# Patient Record
Sex: Female | Born: 1950 | Race: White | Hispanic: No | Smoking: Never smoker
Health system: Southern US, Community
[De-identification: ages and names within clinical notes are randomized; demographics above are authoritative.]

## PROBLEM LIST (undated history)

## (undated) DIAGNOSIS — Z5189 Encounter for other specified aftercare: Secondary | ICD-10-CM

## (undated) DIAGNOSIS — K922 Gastrointestinal hemorrhage, unspecified: Secondary | ICD-10-CM

## (undated) DIAGNOSIS — IMO0001 Reserved for inherently not codable concepts without codable children: Secondary | ICD-10-CM

## (undated) HISTORY — PX: ABDOMINAL HYSTERECTOMY: SHX81

## (undated) HISTORY — PX: TONSILLECTOMY: SUR1361

---

## 2011-03-18 ENCOUNTER — Emergency Department (INDEPENDENT_AMBULATORY_CARE_PROVIDER_SITE_OTHER): Payer: BC Managed Care – PPO

## 2011-03-18 ENCOUNTER — Encounter: Payer: Self-pay | Admitting: *Deleted

## 2011-03-18 ENCOUNTER — Emergency Department (HOSPITAL_BASED_OUTPATIENT_CLINIC_OR_DEPARTMENT_OTHER)
Admission: EM | Admit: 2011-03-18 | Discharge: 2011-03-19 | Disposition: A | Discharge status: Home / Self Care | Payer: BC Managed Care – PPO | Attending: Emergency Medicine | Admitting: Emergency Medicine

## 2011-03-18 DIAGNOSIS — M79609 Pain in unspecified limb: Secondary | ICD-10-CM | POA: Insufficient documentation

## 2011-03-18 DIAGNOSIS — S8010XA Contusion of unspecified lower leg, initial encounter: Secondary | ICD-10-CM

## 2011-03-18 DIAGNOSIS — W1809XA Striking against other object with subsequent fall, initial encounter: Secondary | ICD-10-CM

## 2011-03-18 DIAGNOSIS — T148XXA Other injury of unspecified body region, initial encounter: Secondary | ICD-10-CM

## 2011-03-18 DIAGNOSIS — W010XXA Fall on same level from slipping, tripping and stumbling without subsequent striking against object, initial encounter: Secondary | ICD-10-CM | POA: Insufficient documentation

## 2011-03-18 DIAGNOSIS — Y92009 Unspecified place in unspecified non-institutional (private) residence as the place of occurrence of the external cause: Secondary | ICD-10-CM | POA: Insufficient documentation

## 2011-03-18 DIAGNOSIS — S022XXA Fracture of nasal bones, initial encounter for closed fracture: Secondary | ICD-10-CM | POA: Insufficient documentation

## 2011-03-18 NOTE — ED Notes (Signed)
Pt tripped over her home gym and injured her nose and left lower leg. Pt's nose was bleeding but now has stopped. Pt has small superficial lac to bridge of nose.

## 2011-03-19 MED ORDER — TETANUS-DIPHTH-ACELL PERTUSSIS 5-2.5-18.5 LF-MCG/0.5 IM SUSP
INTRAMUSCULAR | Status: AC
  Filled 2011-03-19: qty 0.5

## 2011-03-19 MED ORDER — HYDROCODONE-ACETAMINOPHEN 5-500 MG PO TABS: 1.0000 | ORAL_TABLET | Freq: Four times a day (QID) | ORAL | Status: AC | PRN

## 2011-03-19 MED ORDER — TETANUS-DIPHTH-ACELL PERTUSSIS 5-2.5-18.5 LF-MCG/0.5 IM SUSP
0.5000 mL | Freq: Once | INTRAMUSCULAR | Status: AC
  Administered 2011-03-19: 0.5 mL via INTRAMUSCULAR

## 2011-03-19 NOTE — ED Provider Notes (Signed)
History     CSN: [id number] Arrival date & time: 03/18/2011 10:02 PM  Chief Complaint  Patient presents with  . Fall   HPI Patient tripped and fell over furniture tonight injuring her left shin and nose no loss of consciousness she complains of no pain presently. No other injury  History reviewed. No pertinent past medical history.  Past Surgical History  Procedure Date  . Abdominal hysterectomy     No family history on file.  History  Substance Use Topics  . Smoking status: Never Smoker   . Smokeless tobacco: Not on file  . Alcohol Use: Yes    OB History    Grav Para Term Preterm Abortions TAB SAB Ect Mult Living                  Review of Systems  Constitutional: Negative.   HENT: Positive for nosebleeds.        Suffered nosebleed and laceration to bridge of nose as result of fall  Respiratory: Negative.   Cardiovascular: Negative.   Gastrointestinal: Negative.   Musculoskeletal: Negative.   Skin: Negative.   Neurological: Negative.   Hematological: Negative.   Psychiatric/Behavioral: Negative.     Physical Exam  BP 117/72  Pulse 81  Temp(Src) 97.5 F (36.4 C) (Oral)  Resp 17  Ht 5\' 4"  (1.626 m)  Wt 130 lb (58.968 kg)  BMI 22.31 kg/m2  SpO2 98%  Physical Exam  Constitutional: She is oriented to person, place, and time. She appears well-developed and well-nourished.  HENT:  Head: Normocephalic and atraumatic.  Right Ear: External ear normal.  Left Ear: External ear normal.       Dried blood at both nares no septal hematoma there is a 0.5 cm laceration at the bridge of the nose with edges together no active bleeding minimal soft tissue swelling to nose  Eyes: Conjunctivae are normal. Pupils are equal, round, and reactive to light.  Neck: Neck supple. No tracheal deviation present. No thyromegaly present.       No tenderness  Cardiovascular: Normal rate and regular rhythm.   No murmur heard. Pulmonary/Chest: Effort normal and breath sounds normal.    Abdominal: Soft. Bowel sounds are normal. She exhibits no distension. There is no tenderness.  Musculoskeletal: Normal range of motion. She exhibits no edema and no tenderness.  Neurological: She is alert and oriented to person, place, and time. Coordination normal.       Gait normal  Skin: Skin is warm and dry. No rash noted.       A 3 cm purplish ecchymosis to the left distal shin with minimal corresponding tenderness no soft tissue swelling  Psychiatric: She has a normal mood and affect.    ED Course  Procedures  MDM Nasal lacerations not require repair plan prescription hydrocodone-APAP ENT referral Dr. Annalee Genta when necessary one week      Doug Sou, MD 03/19/11 0127

## 2011-05-21 ENCOUNTER — Other Ambulatory Visit: Payer: Self-pay

## 2011-05-21 ENCOUNTER — Emergency Department (HOSPITAL_BASED_OUTPATIENT_CLINIC_OR_DEPARTMENT_OTHER)
Admission: EM | Admit: 2011-05-21 | Discharge: 2011-05-21 | Disposition: A | Discharge status: Home / Self Care | Payer: BC Managed Care – PPO | Attending: Emergency Medicine | Admitting: Emergency Medicine

## 2011-05-21 ENCOUNTER — Encounter (HOSPITAL_BASED_OUTPATIENT_CLINIC_OR_DEPARTMENT_OTHER): Payer: Self-pay | Admitting: *Deleted

## 2011-05-21 DIAGNOSIS — D649 Anemia, unspecified: Secondary | ICD-10-CM | POA: Insufficient documentation

## 2011-05-21 DIAGNOSIS — K922 Gastrointestinal hemorrhage, unspecified: Secondary | ICD-10-CM

## 2011-05-21 DIAGNOSIS — R42 Dizziness and giddiness: Secondary | ICD-10-CM | POA: Insufficient documentation

## 2011-05-21 DIAGNOSIS — F410 Panic disorder [episodic paroxysmal anxiety] without agoraphobia: Secondary | ICD-10-CM | POA: Insufficient documentation

## 2011-05-21 HISTORY — DX: Gastrointestinal hemorrhage, unspecified: K92.2

## 2011-05-21 HISTORY — DX: Encounter for other specified aftercare: Z51.89

## 2011-05-21 HISTORY — DX: Reserved for inherently not codable concepts without codable children: IMO0001

## 2011-05-21 LAB — BASIC METABOLIC PANEL
BUN: 22 mg/dL (ref 6–23)
Chloride: 104 mEq/L (ref 96–112)
GFR calc Af Amer: >90 mL/min (ref >90)
GFR calc non Af Amer: >90 mL/min (ref >90)
Potassium: 3.7 mEq/L (ref 3.5–5.1)
Sodium: 139 mEq/L (ref 135–145)

## 2011-05-21 LAB — CBC
HCT: 22.7 % — ABNORMAL LOW (ref 36.0–46.0)
Hemoglobin: 7.7 g/dL — ABNORMAL LOW (ref 12.0–15.0)
MCHC: 33.9 g/dL (ref 30.0–36.0)
RDW: 12.6 % (ref 11.5–15.5)
WBC: 7.6 10*3/uL (ref 4.0–10.5)

## 2011-05-21 LAB — APTT: aPTT: 25 seconds (ref 24–37)

## 2011-05-21 LAB — PROTIME-INR: INR: 0.94 (ref 0.00–1.49)

## 2011-05-21 MED ORDER — LORAZEPAM 1 MG PO TABS
1.0000 mg | ORAL_TABLET | Freq: Once | ORAL | Status: AC
  Administered 2011-05-21: 1 mg via ORAL
  Filled 2011-05-21: qty 1

## 2011-05-21 MED ORDER — ONDANSETRON HCL 4 MG/2ML IJ SOLN
4.0000 mg | Freq: Once | INTRAMUSCULAR | Status: AC
  Administered 2011-05-21: 4 mg via INTRAVENOUS
  Filled 2011-05-21: qty 2

## 2011-05-21 NOTE — ED Notes (Signed)
Family at bedside.Pt reports "panic attack" today with chest tightness and SOB symtom resolved upon arrival

## 2011-05-21 NOTE — ED Notes (Signed)
Pt states she has been dizzy for a few days and had chigger bites on legs. Took allergy medicine and began having black stools, felt like she was having a panic attack. Went home, ate bananas and drank water which usually helps, but it didn't. Mother is sick and her job is stressful. H/A this a.m. None now.

## 2011-05-21 NOTE — ED Provider Notes (Addendum)
History    Scribed for Nat Christen, MD, the patient was seen in room MH08/MH08. This chart was scribed by Katha Cabal. This patient's care was started at 3:52 PM.    CSN: [id number] Arrival date & time: 05/21/2011  3:36 PM  Chief Complaint  Patient presents with  . Panic Attack    (Consider location/radiation/quality/duration/timing/severity/associated sxs/prior treatment) HPI  Jocelyn Green is a 60 y.o. female who presents to the Emergency Department complaining of gradual onset of panic attack.  Reports intermittent dizziness for last 3 days.  Patient complains of  tremors, bilaterally lower extremity weakness, nausea, and headache this AM.  Patient has had dark BMs for past couple days. States previous dark stools in past required her to have blood transfusion for GI bleed. Denies chest pain, vomiting, and abdominal pain.  Reports that she has panic attacks previously with similar sx.  Reports increase in stress with mother's Alzheimer's disease and financial stress.   Patient adds that she has chigger bites on legs.  Per son:  Patient using Benadryl to control itching. [facility]      PAST MEDICAL HISTORY:  Past Medical History  Diagnosis Date  . Blood transfusion   . GI bleed     PAST SURGICAL HISTORY:  Past Surgical History  Procedure Date  . Abdominal hysterectomy     FAMILY HISTORY:  History reviewed. No pertinent family history.   SOCIAL HISTORY: History   Social History  . Marital Status: Divorced    Spouse Name: N/A    Number of Children: N/A  . Years of Education: N/A   Social History Main Topics  . Smoking status: Never Smoker   . Smokeless tobacco: None  . Alcohol Use: Yes  . Drug Use: No  . Sexually Active:    Other Topics Concern  . None   Social History Narrative  . None    Review of Systems 10 Systems reviewed and are negative for acute change except as noted in the HPI.  Allergies  Review of patient's allergies  indicates no known allergies.  Home Medications   Current Outpatient Rx  Name Route Sig Dispense Refill  . DIPHENHYDRAMINE HCL 25 MG PO CAPS Oral Take 50 mg by mouth every 6 (six) hours as needed. Seasonal allergies     . OXYMETAZOLINE HCL 0.05 % NA SOLN Nasal Place 1-2 sprays into the nose at bedtime. For congestion       BP 163/80  Pulse 90  Temp(Src) 98 F (36.7 C) (Oral)  Resp 24  Ht 5\' 4"  (1.626 m)  Wt 130 lb (58.968 kg)  BMI 22.31 kg/m2  SpO2 100%  Physical Exam  Constitutional: She is oriented to person, place, and time. She appears well-developed and well-nourished.  Non-toxic appearance. She does not have a sickly appearance.  HENT:  Head: Normocephalic and atraumatic.  Eyes: Conjunctivae, EOM and lids are normal. Pupils are equal, round, and reactive to light. No scleral icterus.  Neck: Trachea normal and normal range of motion. Neck supple.  Cardiovascular: Normal rate, regular rhythm and normal heart sounds.  Exam reveals no gallop and no friction rub.   No murmur heard. Pulmonary/Chest: Effort normal and breath sounds normal. No respiratory distress. She has no wheezes.  Abdominal: Soft. Normal appearance. There is no tenderness. There is no rebound, no guarding and no CVA tenderness.  Genitourinary: Rectum normal. Rectal exam shows no external hemorrhoid, no internal hemorrhoid, no tenderness and anal tone normal.  Hemoccult positive with dark stool.    Musculoskeletal: Normal range of motion.  Neurological: She is alert and oriented to person, place, and time. She has normal strength. No sensory deficit. Gait normal.       No facial asymmetry.  No visual fields deficits.   Speech clear.    Skin: Skin is warm, dry and intact. No rash noted.  Psychiatric: Her speech is normal. Her mood appears anxious.    ED Course  Procedures (including critical care time)  OTHER DATA REVIEWED: Nursing notes, vital signs, and past medical records reviewed.   DIAGNOSTIC  STUDIES: Oxygen Saturation is 100% on room air, normal by my interpretation.       LABS / RADIOLOGY:  Labs Reviewed  CBC - Abnormal; Notable for the following:    RBC 2.39 (*)    Hemoglobin 7.7 (*)    HCT 22.7 (*)    All other components within normal limits  BASIC METABOLIC PANEL - Abnormal; Notable for the following:    Glucose, Bld 111 (*)    All other components within normal limits  PROTIME-INR  APTT   Results for orders placed during the hospital encounter of 05/21/11  CBC      Component Value Range   WBC 7.6  4.0 - 10.5 (K/uL)   RBC 2.39 (*) 3.87 - 5.11 (MIL/uL)   Hemoglobin 7.7 (*) 12.0 - 15.0 (g/dL)   HCT 04.5 (*) 40.9 - 46.0 (%)   MCV 95.0  78.0 - 100.0 (fL)   MCH 32.2  26.0 - 34.0 (pg)   MCHC 33.9  30.0 - 36.0 (g/dL)   RDW 81.1  91.4 - 78.2 (%)   Platelets 224  150 - 400 (K/uL)  BASIC METABOLIC PANEL      Component Value Range   Sodium 139  135 - 145 (mEq/L)   Potassium 3.7  3.5 - 5.1 (mEq/L)   Chloride 104  96 - 112 (mEq/L)   CO2 27  19 - 32 (mEq/L)   Glucose, Bld 111 (*) 70 - 99 (mg/dL)   BUN 22  6 - 23 (mg/dL)   Creatinine, Ser 9.56  0.50 - 1.10 (mg/dL)   Calcium 9.4  8.4 - 21.3 (mg/dL)   GFR calc non Af Amer >90  >90 (mL/min)   GFR calc Af Amer >90  >90 (mL/min)  PROTIME-INR      Component Value Range   Prothrombin Time 12.8  11.6 - 15.2 (seconds)   INR 0.94  0.00 - 1.49   APTT      Component Value Range   aPTT 25  24 - 37 (seconds)        ED COURSE / COORDINATION OF CARE: 4:22 PM  Physical exam complete.  Patient is anxious.  Rectal exam normal.  Hemoccult positive with dark stool.  No melena.   5:55 PM  Recheck.  Oxygen turned off to assess breathing on room air.    Orders Placed This Encounter  Procedures  . CBC  . Basic metabolic panel  . Protime-INR  . APTT  . EKG test  . Saline lock IV      MEDICATIONS GIVEN IN THE E.D. Scheduled Meds:    . LORazepam  1 mg Oral Once  . ondansetron [facility]) IV  4 mg Intravenous Once    Continuous Infusions:    Date: 05/21/2011  Rate: 82  Rhythm: normal sinus rhythm  QRS Axis: normal  Intervals: normal  ST/T Wave abnormalities: normal  Conduction Disutrbances:none  Narrative Interpretation:   Old EKG Reviewed: none available     MDM  MDM: Patient with GI bleed on history with her Hemoccult-positive exam. Her vitals have been stable here and as we do not have a regular blood bank here I have not completed a type and screen here. I feel at this point in time the patient can be transferred safely to [facility] regional without beginning blood transfusion here. The hospitalist Dr. Janit Pagan at [facility] is aware of this fact. He has accepted this patient in transfer for admission to a medical telemetry bed for further repeat hemoglobins and evaluation. Patient is aware of her pending transfer and is okay with being transferred to [facility] regional as this was her choice. Patient does not appear to have any cardiac events causing her symptoms her dizziness as her dizziness is likely due to her anemia. She may have some overriding component of anxiety given her other stressors at this time. These anxiety symptoms have significantly improved with the Ativan here.   CRITICAL CARE Performed by: Emeline General A   Total critical care time: 36 min  Critical care time was exclusive of separately billable procedures and treating other patients.  Critical care was necessary to treat or prevent imminent or life-threatening deterioration.  Critical care was time spent personally by me on the following activities: development of treatment plan with patient and/or surrogate as well as nursing, discussions with consultants, evaluation of patient's response to treatment, examination of patient, obtaining history from patient or surrogate, ordering and performing treatments and interventions, ordering and review of laboratory studies, ordering and review of radiographic  studies, pulse oximetry and re-evaluation of patient's condition.    IMPRESSION: No diagnosis found.     I personally performed the services described in this documentation, which was scribed in my presence. The recorded information has been reviewed and considered.          Nat Christen, MD 05/21/11 1820  Nat Christen, MD 05/21/11 Rickey Primus

## 2011-12-26 ENCOUNTER — Emergency Department (HOSPITAL_BASED_OUTPATIENT_CLINIC_OR_DEPARTMENT_OTHER): Payer: BC Managed Care – PPO

## 2011-12-26 ENCOUNTER — Encounter (HOSPITAL_BASED_OUTPATIENT_CLINIC_OR_DEPARTMENT_OTHER): Payer: Self-pay | Admitting: Family Medicine

## 2011-12-26 ENCOUNTER — Emergency Department (HOSPITAL_BASED_OUTPATIENT_CLINIC_OR_DEPARTMENT_OTHER)
Admission: EM | Admit: 2011-12-26 | Discharge: 2011-12-26 | Disposition: A | Discharge status: Home / Self Care | Payer: BC Managed Care – PPO | Attending: Emergency Medicine | Admitting: Emergency Medicine

## 2011-12-26 DIAGNOSIS — M654 Radial styloid tenosynovitis [de Quervain]: Secondary | ICD-10-CM | POA: Insufficient documentation

## 2011-12-26 DIAGNOSIS — M25539 Pain in unspecified wrist: Secondary | ICD-10-CM | POA: Insufficient documentation

## 2011-12-26 NOTE — ED Provider Notes (Signed)
History     CSN: [id number]  Arrival date & time 12/26/11  1235   First MD Initiated Contact with Patient 12/26/11 1418      Chief Complaint  Patient presents with  . 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  She's had a gradual onset over the last several weeks without specific trauma of pain at the base of her left thumb without weakness or numbness. She is no redness or swelling. She is no specific injury to the area exception bumped it slightly about a month ago but not have any concern of a broken bone at that time. She has had no treatment prior to arrival. She is no radiation of her discomfort. She does not want pain medicine. Her discomfort is mild to moderate. She suspects she has tendinitis from overuse but wants to make sure. Her pain is localized, it is tender to palpation, and it is worse with extension and abduction of her thumb. Past Medical History  Diagnosis Date  . Blood transfusion   . GI bleed     Past Surgical History  Procedure Date  . Abdominal hysterectomy     No family history on file.  History  Substance Use Topics  . Smoking status: Never Smoker   . Smokeless tobacco: Not on file  . Alcohol Use: Yes    OB History    Grav Para Term Preterm Abortions TAB SAB Ect Mult Living                  Review of Systems  Constitutional: Negative for fever.       10 Systems reviewed and are negative for acute change except as noted in the HPI.  HENT: Negative for congestion.   Eyes: Negative for discharge and redness.  Respiratory: Negative for cough and shortness of breath.   Cardiovascular: Negative for chest pain.  Gastrointestinal: Negative for vomiting and abdominal pain.  Musculoskeletal: Negative for back pain.    Skin: Negative for rash.  Neurological: Negative for syncope, numbness and headaches.  Psychiatric/Behavioral:       No behavior change.    Allergies  Review of patient's allergies indicates no known allergies.  Home Medications   Current Outpatient Rx  Name Route Sig Dispense Refill  . DIPHENHYDRAMINE HCL 25 MG PO CAPS Oral Take 50 mg by mouth every 6 (six) hours as needed. Seasonal allergies     . OXYMETAZOLINE HCL 0.05 % NA SOLN Nasal Place 1-2 sprays into the nose at bedtime. For congestion       BP 162/95  Pulse 72  Temp(Src) 97.6 F (36.4 C) (Oral)  Resp 16  SpO2 100%  Physical Exam  Nursing note and vitals reviewed. Constitutional:       Awake, alert, nontoxic appearance.  HENT:  Head: Atraumatic.  Eyes: Right eye exhibits no discharge. Left eye exhibits no discharge.  Neck: Neck supple.  Pulmonary/Chest: Effort normal. She exhibits no tenderness.  Abdominal: Soft. There is no tenderness. There is no rebound.  Musculoskeletal: She exhibits tenderness. She exhibits no edema.       Baseline ROM, no obvious new focal weakness. Both legs and right arm are nontender the left arm is nontender the shoulder upper arm elbow forearm and wrist including no snuffbox tenderness. She does have reproducible base of the  left thumb tenderness mostly over the radial-carpal tendon area with no tenderness over the thenar eminence and normal light touch with capillary refill less than 2 seconds good extension against resistance as well as good flexion of the thumb against resistance. She is no bony or joint pain to the interphalangeal joint or metacarpophalangeal joint of the left thumb.  Neurological:       Mental status and motor strength appears baseline for patient and situation.  Skin: No rash noted.  Psychiatric: She has a normal mood and affect.    ED Course  Procedures (including critical care time)  Labs Reviewed - No data to display No results found.   1. De Quervain's  tenosynovitis, left       MDM  Pt stable in ED with no significant deterioration in condition.Patient / Family / Caregiver informed of clinical course, understand medical decision-making process, and agree with plan.I doubt any other EMC precluding discharge at this time including, but not necessarily limited to the following:SBI.        Hurman Horn, MD 01/02/12 1740

## 2011-12-26 NOTE — Discharge Instructions (Signed)
De Quervain's Tenosynovitis De Quervain's tenosynovitis involves inflammation of one or two tendon linings (sheaths) or strain of one or two tendons to the thumb: extensor pollicis brevis (EPB), or abductor pollicis longus (APL). This causes pain on the side of the wrist and base of the thumb. Tendon sheaths secrete a fluid that lubricates the tendon, allowing the tendon to move smoothly. When the sheath becomes inflamed, the tendon cannot move freely in the sheath. Both the EPB and APL tendons are important for proper use of the hand. The EPB tendon is important for straightening the thumb. The APL tendon is important for moving the thumb away from the index finger (abducting). The two tendons pass through a small tube (canal) in the wrist, near the base of the thumb. When the tendons become inflamed, pain is usually felt in this area. SYMPTOMS   Pain, tenderness, swelling, warmth, or redness over the base of the thumb and thumb side of the wrist.   Pain that gets worse when straightening the thumb.   Pain that gets worse when moving the thumb away from the index finger, against resistance.   Pain with pinching or gripping.   Locking or catching of the thumb.   Limited motion of the thumb.   Crackling sound (crepitation) when the tendon or thumb is moved or touched.   Fluid-filled cyst in the area of the base of the thumb.  CAUSES   Tenosynovitis is often linked with overuse of the wrist.   Tenosynovitis may be caused by repeated injury to the thumb muscle and tendon units, and with repeated motions of the hand and wrist, due to friction of the tendon within the lining (sheath).   Tenosynovitis may also be due to a sudden increase in activity or change in activity.  RISK INCREASES WITH:  Sports that involve repeated hand and wrist motions (golf, bowling, tennis, squash, racquetball).   Heavy labor.   Poor physical wrist strength and flexibility.   Failure to warm up properly before  practice or play.   Female gender.   New mothers who hold their baby's head for long periods or lift infants with thumbs in the infant's armpit (axilla).  PREVENTION  Warm up and stretch properly before practice or competition.   Allow enough time for rest and recovery between practices and competition.   Maintain appropriate conditioning:   Cardiovascular fitness.   Forearm, wrist, and hand flexibility.   Muscle strength and endurance.   Use proper exercise technique.  PROGNOSIS  This condition is usually curable within 6 weeks, if treated properly with non-surgical treatment and resting of the affected area.  RELATED COMPLICATIONS   Longer healing time if not properly treated or if not given enough time to heal.   Chronic inflammation, causing recurring symptoms of tenosynovitis. Permanent pain or restriction of movement.   Risks of surgery: infection, bleeding, injury to nerves (numbness of the thumb), continued pain, incomplete release of the tendon sheath, recurring symptoms, cutting of the tendons, tendons sliding out of position, weakness of the thumb, thumb stiffness.  TREATMENT  First, treatment involves the use of medicine and ice, to reduce pain and inflammation. Patients are encouraged to stop or modify activities that aggravate the injury. Stretching and strengthening exercises may be advised. Exercises may be completed at home or with a therapist. You may be fitted with a brace or splint, to limit motion and allow the injury to heal. Your caregiver may also choose to give you a corticosteroid injection, to   reduce the pain and inflammation. If non-surgical treatment is not successful, surgery may be needed. Most tenosynovitis surgeries are done as outpatient procedures (you go home the same day). Surgery may involve local, regional (whole arm), or general anesthesia.  MEDICATION   If pain medicine is needed, nonsteroidal anti-inflammatory medicines (aspirin and  ibuprofen), or other minor pain relievers (acetaminophen), are often advised.   Do not take pain medicine for 7 days before surgery.   Prescription pain relievers are often prescribed only after surgery. Use only as directed and only as much as you need.   Corticosteroid injections may be given if your caregiver thinks they are needed. There is a limited number of times these injections may be given.  COLD THERAPY   Cold treatment (icing) should be applied for 10 to 15 minutes every 2 to 3 hours for inflammation and pain, and immediately after activity that aggravates your symptoms. Use ice packs or an ice massage.  SEEK MEDICAL CARE IF:   Symptoms get worse or do not improve in 2 to 4 weeks, despite treatment.   You experience pain, numbness, or coldness in the hand.   Blue, gray, or dark color appears in the fingernails.   Any of the following occur after surgery: increased pain, swelling, redness, drainage of fluids, bleeding in the affected area, or signs of infection.   New, unexplained symptoms develop. (Drugs used in treatment may produce side effects.)  Document Released: 07/25/2005 Document Revised: 07/14/2011 Document Reviewed: 11/06/2008 [employer] Patient Information 2012 [employer]. 

## 2011-12-26 NOTE — ED Notes (Signed)
Pt c/o left wrist and left thumb pain for "weeks". Pt denies injury.

## 2012-05-03 ENCOUNTER — Encounter (HOSPITAL_BASED_OUTPATIENT_CLINIC_OR_DEPARTMENT_OTHER): Payer: Self-pay

## 2012-05-03 ENCOUNTER — Emergency Department (HOSPITAL_BASED_OUTPATIENT_CLINIC_OR_DEPARTMENT_OTHER)
Admission: EM | Admit: 2012-05-03 | Discharge: 2012-05-03 | Disposition: A | Discharge status: Home / Self Care | Payer: BC Managed Care – PPO | Attending: Emergency Medicine | Admitting: Emergency Medicine

## 2012-05-03 ENCOUNTER — Emergency Department (HOSPITAL_BASED_OUTPATIENT_CLINIC_OR_DEPARTMENT_OTHER): Payer: BC Managed Care – PPO

## 2012-05-03 DIAGNOSIS — IMO0002 Reserved for concepts with insufficient information to code with codable children: Secondary | ICD-10-CM

## 2012-05-03 DIAGNOSIS — M25473 Effusion, unspecified ankle: Secondary | ICD-10-CM | POA: Insufficient documentation

## 2012-05-03 DIAGNOSIS — M25476 Effusion, unspecified foot: Secondary | ICD-10-CM | POA: Insufficient documentation

## 2012-05-03 DIAGNOSIS — M7989 Other specified soft tissue disorders: Secondary | ICD-10-CM | POA: Insufficient documentation

## 2012-05-03 NOTE — ED Provider Notes (Signed)
Medical screening examination/treatment/procedure(s) were performed by non-physician practitioner and as supervising physician I was immediately available for consultation/collaboration.   Glena Pharris, MD 05/03/12 1756 

## 2012-05-03 NOTE — ED Provider Notes (Signed)
History     CSN: [id number]  Arrival date & time 05/03/12  1437   First MD Initiated Contact with Patient 05/03/12 [phone]      Chief Complaint  Patient presents with  . Foot Injury    (Consider location/radiation/quality/duration/timing/severity/associated sxs/prior treatment) Patient is a 61 y.o. female presenting with foot injury. The history is provided by the patient. No language interpreter was used.  Foot Injury  Incident onset: 2 weeks. The incident occurred at home. There was no injury mechanism. The pain is present in the left foot. The quality of the pain is described as aching. The pain is at a severity of 5/10. The pain is moderate. The pain has been constant since onset. Associated symptoms include inability to bear weight. Nothing aggravates the symptoms. She has tried nothing for the symptoms.  pt reports she hit a piece of white metal.  Past Medical History  Diagnosis Date  . Blood transfusion   . GI bleed     Past Surgical History  Procedure Date  . Abdominal hysterectomy     No family history on file.  History  Substance Use Topics  . Smoking status: Never Smoker   . Smokeless tobacco: Not on file  . Alcohol Use: Yes    OB History    Grav Para Term Preterm Abortions TAB SAB Ect Mult Living                  Review of Systems  Musculoskeletal: Positive for myalgias and joint swelling.  All other systems reviewed and are negative.    Allergies  Review of patient's allergies indicates no known allergies.  Home Medications   Current Outpatient Rx  Name Route Sig Dispense Refill  . DIPHENHYDRAMINE HCL 25 MG PO CAPS Oral Take 50 mg by mouth every 6 (six) hours as needed. Seasonal allergies     . OXYMETAZOLINE HCL 0.05 % NA SOLN Nasal Place 1-2 sprays into the nose at bedtime. For congestion       BP 169/84  Pulse 88  Temp 98.3 F (36.8 C) (Oral)  Resp 16  Ht 5\' 4"  (1.626 m)  Wt 128 lb (58.06 kg)  BMI 21.97 kg/m2  SpO2 100%  Physical  Exam  Nursing note and vitals reviewed. Constitutional: She is oriented to person, place, and time. She appears well-developed and well-nourished.  HENT:  Head: Normocephalic and atraumatic.  Musculoskeletal: She exhibits tenderness.       Tender left foot,  From,  Ns and nv intact  Neurological: She is alert and oriented to person, place, and time.  Skin: Skin is warm and dry.  Psychiatric: She has a normal mood and affect.    ED Course  Procedures (including critical care time)  Labs Reviewed - No data to display Dg Foot Complete Left  05/03/2012  *RADIOLOGY REPORT*  Clinical Data: Foot injury.  LEFT FOOT - COMPLETE 3+ VIEW  Comparison: Not  Findings: No acute bony abnormality.  Specifically, no fracture, subluxation, or dislocation.  Soft tissues are intact. Joint spaces are maintained.  Normal bone mineralization.  IMPRESSION: Normal study.   Original Report Authenticated By: Cyndie Chime, M.D.      1. Swelling of joint of left ankle or foot       MDM  Pt placed ina  Jones dressing and post op shoe.   I advised follow up with Dr. Pearletha Forge.        Lonia Skinner [place], [place] 05/03/12 1618  Lonia Skinner  [place], [place] 05/03/12 1621

## 2012-05-03 NOTE — ED Notes (Signed)
Left foot injury 3 weeks ago-c/o cont'd swelling, pain

## 2012-05-08 ENCOUNTER — Ambulatory Visit (INDEPENDENT_AMBULATORY_CARE_PROVIDER_SITE_OTHER): Payer: BC Managed Care – PPO | Admitting: Family Medicine

## 2012-05-08 ENCOUNTER — Encounter: Payer: Self-pay | Admitting: Family Medicine

## 2012-05-08 VITALS — BP 152/91 | HR 78 | Ht 64.0 in | Wt 130.0 lb

## 2012-05-08 DIAGNOSIS — M1990 Unspecified osteoarthritis, unspecified site: Secondary | ICD-10-CM

## 2012-05-08 DIAGNOSIS — M129 Arthropathy, unspecified: Secondary | ICD-10-CM

## 2012-05-08 DIAGNOSIS — S92323A Displaced fracture of second metatarsal bone, unspecified foot, initial encounter for closed fracture: Secondary | ICD-10-CM

## 2012-05-08 DIAGNOSIS — S92309A Fracture of unspecified metatarsal bone(s), unspecified foot, initial encounter for closed fracture: Secondary | ICD-10-CM

## 2012-05-08 DIAGNOSIS — M654 Radial styloid tenosynovitis [de Quervain]: Secondary | ICD-10-CM

## 2012-05-08 NOTE — Patient Instructions (Addendum)
You have a 2nd metatarsal fracture that is healing well by ultrasound. Ice area 15 minutes at a time as needed. Elevate above the level of your heart when possible to help with swelling as needed. Cam walker or cast with postop shoe - will need immobilization likely for a full 6 weeks (so 3-4 more weeks). Tylenol as needed during day, oxycodone as needed in evenings (no driving on oxycodone and no driving with a cast or the boot on). Follow up with me in 2 weeks if in a cast otherwise in 3-4 weeks if you're wearing the boot. These are very stable fractures and heal well with this strategy.  For arthritis: Take tylenol 500mg  1-2 tabs three times a day for pain. Glucosamine sulfate 750mg  twice a day is a supplement that has been shown to help moderate to severe arthritis. Capsaicin topically up to four times a day may also help with pain. Cortisone injections are an option for certain joints (knee, shoulder, hip). If cortisone injections do not help, there are different types of shots that may help but they take longer to take effect. It's important that you continue to stay active. Consider physical therapy to strengthen muscles around the joint that hurts to take pressure off of the joint itself. Shoe inserts with good arch support may be helpful. Walker or cane if needed. Heat or ice 15 minutes at a time 3-4 times a day as needed to help with pain. Water aerobics and cycling with low resistance are the best two types of exercise for arthritis.  You have deQuervain's tenosynovitis, a degeneration and scar tissue formation within the tendons that go into your thumb. Avoid painful activities as much as possible. Wear the thumb spica brace as often as possible to rest this. Ice 15 minutes at a time 3-4 times a day A cortisone injection typically helps a great deal with this and is an option. You can repeat up to 2 injections but if these still do not help, surgical referral is indicated.

## 2012-05-14 ENCOUNTER — Encounter: Payer: Self-pay | Admitting: Family Medicine

## 2012-05-14 DIAGNOSIS — S92323A Displaced fracture of second metatarsal bone, unspecified foot, initial encounter for closed fracture: Secondary | ICD-10-CM | POA: Insufficient documentation

## 2012-05-14 DIAGNOSIS — M654 Radial styloid tenosynovitis [de Quervain]: Secondary | ICD-10-CM | POA: Insufficient documentation

## 2012-05-14 DIAGNOSIS — M1990 Unspecified osteoarthritis, unspecified site: Secondary | ICD-10-CM | POA: Insufficient documentation

## 2012-05-14 NOTE — Progress Notes (Signed)
Subjective:    Patient ID: Jocelyn Green, female    DOB: 26-Mar-1951, 61 y.o.   MRN: [id number]  PCP: [facility]  HPI 61 yo F here primarily for left foot injury but also to discuss left thumb pain, arthritis.  Patient reports about 2-3 weeks ago she accidentally hit top of foot on a large metal object. Very painful with swelling and bruising following this. Limped for about 2 weeks. Radiographs in ED read as normal - put in postop shoe with Jones wrap and referred here. Has been doing this regularly but shoe is painful. Icing some. Difficulty bearing weight comfortably. No prior left foot injuries.  Also reports several weeks radial sided wrist pain. 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 No prior left wrist injuries. Mild swelling but no bruising.  In general asking about medications to treat arthritis as well.  Past Medical History  Diagnosis Date  . Blood transfusion   . GI bleed     No current outpatient prescriptions on file prior to visit.    Past Surgical History  Procedure Date  . Abdominal hysterectomy     No Known Allergies  History   Social History  . Marital Status: Divorced    Spouse Name: N/A    Number of Children: N/A  . Years of Education: N/A   Occupational History  . Not on file.   Social History Main Topics  . Smoking status: Never Smoker   . Smokeless tobacco: Not on file  . Alcohol Use: Yes  . Drug Use: No  . Sexually Active: Not on file   Other Topics Concern  . Not on file   Social History Narrative  . No narrative on file    Family History  Problem Relation Age of Onset  . Sudden death Neg Hx   . Hyperlipidemia Neg Hx   . Heart attack Neg Hx   . Diabetes Neg Hx   . Hypertension Neg Hx     BP 152/91  Pulse 78  Ht 5\' 4"  (1.626 m)  Wt 130 lb (58.968 kg)  BMI 22.31 kg/m2  Review of Systems See HPI above.    Objective:   Physical Exam Gen: NAD  L foot/ankle: Mild swelling dorsal foot.  No bruising,  other deformity. FROM TTP 2nd > 3rd metatarsal.  No other TTP about foot or ankle. Negative ant drawer and talar tilt.   Negative syndesmotic compression. Thompsons test negative. NV intact distally.  L hand/wrist: No gross deformity, swelling, ecchymoses FROM TTP 1st dorsal compartment. + finkelsteins. NVI distally.  MSK u/s: 2nd metatarsal fracture identified with callus formation, increased neovascularity, and edema overlying bony cortex.    Assessment & Plan:  1. 2nd metatarsal fracture - healing well by ultrasound.  Cam walker for total of 6 weeks.  Icing, elevation.  Tylenol as needed during day with percocet as needed (q6h prn #60) in evenings and bedtime.  F/u in 3-4 weeks for reevaluation.  2. DeQuervain's tenosynovitis - discussed thumb spica brace +/- injection.  She would like to do injection today.  Icing discussed.  Discuss how she's doing at f/u for #1 above.  After informed written consent patient was seated on exam table.  Area overlying 1st dorsal compartment left wrist was prepped with alcohol swab then 1st dorsal compartment injected with 1:1 marcaine: depomedrol.  Patient tolerated procedure well without immediate complications.  3. Arthritis - Discussed tylenol, capsaicin, glucosamine.  Avoid nsaids with her prior h/o GI  bleed.

## 2012-05-14 NOTE — Assessment & Plan Note (Signed)
healing well by ultrasound.  Cam walker for total of 6 weeks.  Icing, elevation.  Tylenol as needed during day with percocet as needed (q6h prn #60) in evenings and bedtime.  F/u in 3-4 weeks for reevaluation.

## 2012-05-14 NOTE — Assessment & Plan Note (Signed)
Discussed tylenol, capsaicin, glucosamine.  Avoid nsaids with her prior h/o GI bleed.

## 2012-05-14 NOTE — Assessment & Plan Note (Signed)
discussed thumb spica brace +/- injection.  She would like to do injection today.  Icing discussed.  Discuss how she's doing at f/u for #1 above.  After informed written consent patient was seated on exam table.  Area overlying 1st dorsal compartment left wrist was prepped with alcohol swab then 1st dorsal compartment injected with 1:1 marcaine: depomedrol.  Patient tolerated procedure well without immediate complications.

## 2012-06-05 ENCOUNTER — Encounter: Payer: Self-pay | Admitting: Family Medicine

## 2012-06-05 ENCOUNTER — Ambulatory Visit (INDEPENDENT_AMBULATORY_CARE_PROVIDER_SITE_OTHER): Payer: BC Managed Care – PPO | Admitting: Family Medicine

## 2012-06-05 VITALS — BP 137/77 | HR 75 | Ht 64.0 in | Wt 130.0 lb

## 2012-06-05 DIAGNOSIS — S92309A Fracture of unspecified metatarsal bone(s), unspecified foot, initial encounter for closed fracture: Secondary | ICD-10-CM

## 2012-06-05 DIAGNOSIS — M654 Radial styloid tenosynovitis [de Quervain]: Secondary | ICD-10-CM

## 2012-06-05 DIAGNOSIS — S92323A Displaced fracture of second metatarsal bone, unspecified foot, initial encounter for closed fracture: Secondary | ICD-10-CM

## 2012-06-05 NOTE — Progress Notes (Signed)
  Subjective:    Patient ID: Jocelyn Green, female    DOB: [birth year], 61 y.o.   MRN: [id number]  PCP: [facility]  HPI  61 yo F here for f/u left 2nd MT fracture, thumb pain.  10/1: Patient reports about 2-3 weeks ago she accidentally hit top of foot on a large metal object. Very painful with swelling and bruising following this. Limped for about 2 weeks. Radiographs in ED read as normal - put in postop shoe with Jones wrap and referred here. Has been doing this regularly but shoe is painful. Icing some. Difficulty bearing weight comfortably. No prior left foot injuries.  Also reports several weeks radial sided wrist pain. 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 No prior left wrist injuries. Mild swelling but no bruising.  10/29: Patient reports she still has aching in left foot at nighttime but has improved. Has been wearing cam walker regularly with ambulation. Has not tried to put full weight on foot outside of boot. Not taking any medicines now or icing this.  Past Medical History  Diagnosis Date  . Blood transfusion   . GI bleed     No current outpatient prescriptions on file prior to visit.    Past Surgical History  Procedure Date  . Abdominal hysterectomy     No Known Allergies  History   Social History  . Marital Status: Divorced    Spouse Name: N/A    Number of Children: N/A  . Years of Education: N/A   Occupational History  . Not on file.   Social History Main Topics  . Smoking status: Never Smoker   . Smokeless tobacco: Not on file  . Alcohol Use: Yes  . Drug Use: No  . Sexually Active: Not on file   Other Topics Concern  . Not on file   Social History Narrative  . No narrative on file    Family History  Problem Relation Age of Onset  . Sudden death Neg Hx   . Hyperlipidemia Neg Hx   . Heart attack Neg Hx   . Diabetes Neg Hx   . Hypertension Neg Hx     BP 137/77  Pulse 75  Ht 5\' 4"  (1.626 m)  Wt 130 lb (58.968 kg)   BMI 22.31 kg/m2  Review of Systems  See HPI above.    Objective:   Physical Exam  Gen: NAD  L foot/ankle: No swelling dorsal foot.  No bruising, other deformity. FROM Mild TTP 2nd > 3rd metatarsal.  No other TTP about foot or ankle. Negative ant drawer and talar tilt.   Negative syndesmotic compression. Thompsons test negative. NV intact distally.  L hand/wrist: No gross deformity, swelling, ecchymoses FROM No longer with TTP 1st dorsal compartment. Negative finkelsteins. NVI distally.  MSK u/s: 2nd metatarsal with excellent bony callus, no longer with swelling over cortex or neovascularity, appears healed.    Assessment & Plan:  1. 2nd metatarsal fracture - excellent healing now more than 6 weeks out from injury.  Can discontinue cam walker though discussed she may need this with long walking for a couple weeks.  Comfortable well supported shoe for next couple weeks.  F/u prn.  2. DeQuervain's tenosynovitis - improved though not completely so.  Would not repeat injection at this time.  Thumb spica brace.

## 2012-06-05 NOTE — Assessment & Plan Note (Signed)
DeQuervain's tenosynovitis - improved though not completely so.  Would not repeat injection at this time.  Thumb spica brace.

## 2012-06-05 NOTE — Assessment & Plan Note (Signed)
2nd metatarsal fracture - excellent healing now more than 6 weeks out from injury.  Can discontinue cam walker though discussed she may need this with long walking for a couple weeks.  Comfortable well supported shoe for next couple weeks.  F/u prn.

## 2012-10-13 ENCOUNTER — Emergency Department (HOSPITAL_BASED_OUTPATIENT_CLINIC_OR_DEPARTMENT_OTHER): Payer: BC Managed Care – PPO

## 2012-10-13 ENCOUNTER — Encounter (HOSPITAL_BASED_OUTPATIENT_CLINIC_OR_DEPARTMENT_OTHER): Payer: Self-pay

## 2012-10-13 ENCOUNTER — Emergency Department (HOSPITAL_BASED_OUTPATIENT_CLINIC_OR_DEPARTMENT_OTHER)
Admission: EM | Admit: 2012-10-13 | Discharge: 2012-10-13 | Disposition: A | Discharge status: Home / Self Care | Payer: BC Managed Care – PPO | Attending: Emergency Medicine | Admitting: Emergency Medicine

## 2012-10-13 DIAGNOSIS — Y9289 Other specified places as the place of occurrence of the external cause: Secondary | ICD-10-CM | POA: Insufficient documentation

## 2012-10-13 DIAGNOSIS — Y9301 Activity, walking, marching and hiking: Secondary | ICD-10-CM | POA: Insufficient documentation

## 2012-10-13 DIAGNOSIS — S62101A Fracture of unspecified carpal bone, right wrist, initial encounter for closed fracture: Secondary | ICD-10-CM

## 2012-10-13 DIAGNOSIS — S62109A Fracture of unspecified carpal bone, unspecified wrist, initial encounter for closed fracture: Secondary | ICD-10-CM | POA: Insufficient documentation

## 2012-10-13 DIAGNOSIS — Z8719 Personal history of other diseases of the digestive system: Secondary | ICD-10-CM | POA: Insufficient documentation

## 2012-10-13 DIAGNOSIS — W010XXA Fall on same level from slipping, tripping and stumbling without subsequent striking against object, initial encounter: Secondary | ICD-10-CM | POA: Insufficient documentation

## 2012-10-13 MED ORDER — OXYCODONE-ACETAMINOPHEN 5-325 MG PO TABS: 2.0000 | ORAL_TABLET | ORAL | Status: DC | PRN

## 2012-10-13 MED ORDER — METHOCARBAMOL 500 MG PO TABS: 500.0000 mg | ORAL_TABLET | Freq: Two times a day (BID) | ORAL | Status: AC

## 2012-10-13 NOTE — ED Notes (Signed)
Pt was out walking her dog and slipped on the ice injuring her right arm.

## 2012-10-13 NOTE — ED Provider Notes (Signed)
History     CSN: [id number]  Arrival date & time 10/13/12  4098   First MD Initiated Contact with Patient 10/13/12 [phone]      Chief Complaint  Patient presents with  . Fall  . Arm Injury    (Consider location/radiation/quality/duration/timing/severity/associated sxs/prior treatment) Patient is a 62 y.o. female presenting with fall and arm injury. The history is provided by the patient.  Fall The accident occurred less than 1 hour ago. The fall occurred while walking. She fell from a height of 1 to 2 ft. She landed on a hard floor. There was no blood loss. The point of impact was the right wrist. The pain is present in the right wrist. The pain is moderate. She was ambulatory at the scene. There was no drug use involved in the accident. There was no alcohol use involved in the accident. Pertinent negatives include no headaches and no loss of consciousness. The symptoms are aggravated by activity. She has tried nothing for the symptoms.  Arm Injury pt slipped while walking on the ice   Past Medical History  Diagnosis Date  . Blood transfusion   . GI bleed     Past Surgical History  Procedure Laterality Date  . Abdominal hysterectomy    . Tonsillectomy      Family History  Problem Relation Age of Onset  . Sudden death Neg Hx   . Hyperlipidemia Neg Hx   . Heart attack Neg Hx   . Diabetes Neg Hx   . Hypertension Neg Hx     History  Substance Use Topics  . Smoking status: Never Smoker   . Smokeless tobacco: Not on file  . Alcohol Use: Yes     Comment: occasionally    OB History   Grav Para Term Preterm Abortions TAB SAB Ect Mult Living                  Review of Systems  Neurological: Negative for loss of consciousness and headaches.  All other systems reviewed and are negative.    Allergies  Review of patient's allergies indicates no known allergies.  Home Medications   Current Outpatient Rx  Name  Route  Sig  Dispense  Refill  . oxyCODONE-acetaminophen  (PERCOCET/ROXICET) 5-325 MG per tablet                 BP 138/72  Pulse 82  Temp(Src) 98.6 F (37 C) (Oral)  Resp 18  Ht 5\' 4"  (1.626 m)  Wt 126 lb (57.153 kg)  BMI 21.62 kg/m2  SpO2 98%  Physical Exam  Nursing note and vitals reviewed. Constitutional: She is oriented to person, place, and time. She appears well-developed and well-nourished.  Non-toxic appearance. No distress.  HENT:  Head: Normocephalic and atraumatic.  Eyes: Conjunctivae, EOM and lids are normal. Pupils are equal, round, and reactive to light.  Neck: Normal range of motion. Neck supple. No tracheal deviation present. No mass present.  Cardiovascular: Normal rate, regular rhythm and normal heart sounds.  Exam reveals no gallop.   No murmur heard. Pulmonary/Chest: Effort normal and breath sounds normal. No stridor. No respiratory distress. She has no decreased breath sounds. She has no wheezes. She has no rhonchi. She has no rales.  Abdominal: Soft. Normal appearance and bowel sounds are normal. She exhibits no distension. There is no tenderness. There is no rebound and no CVA tenderness.  Musculoskeletal: Normal range of motion. She exhibits no edema and no tenderness.  Right wrist: She exhibits bony tenderness and swelling.       Arms: Neurological: She is alert and oriented to person, place, and time. She has normal strength. No cranial nerve deficit or sensory deficit. GCS eye subscore is 4. GCS verbal subscore is 5. GCS motor subscore is 6.  Skin: Skin is warm and dry. No abrasion and no rash noted.  Psychiatric: She has a normal mood and affect. Her speech is normal and behavior is normal.    ED Course  Procedures (including critical care time)  Labs Reviewed - No data to display No results found.   No diagnosis found.    MDM  Patient had splint applied by orthopedic tech. Will follow with her orthopedist        Toy Baker, MD 10/13/12 [phone]

## 2012-10-13 NOTE — Discharge Instructions (Signed)
Follow up with your orthopedist next week Cast or Splint Care Casts and splints support injured limbs and keep bones from moving while they heal.  HOME CARE  Keep the cast or splint uncovered during the drying period.  A plaster cast can take 24 to 48 hours to dry.  A fiberglass cast will dry in less than 1 hour.  Do not rest the cast on anything harder than a pillow for 24 hours.  Do not put weight on your injured limb. Do not put pressure on the cast. Wait for your doctor's approval.  Keep the cast or splint dry.  Cover the cast or splint with a plastic bag during baths or wet weather.  If you have a cast over your chest and belly (trunk), take sponge baths until the cast is taken off.  Keep your cast or splint clean. Wash a dirty cast with a damp cloth.  Do not put any objects under your cast or splint. Do not scratch the skin under the cast with an object.  Do not take out the padding from inside your cast.  Exercise your joints near the cast as told by your doctor.  Raise (elevate) your injured limb on 1 or 2 pillows for the first 1 to 3 days. GET HELP RIGHT AWAY IF:  Your cast or splint cracks.  Your cast or splint is too tight or too loose.  You itch badly under the cast.  Your cast gets wet or has a soft spot.  You have a bad smell coming from the cast.  You get an object stuck under the cast.  Your skin around the cast becomes red or raw.  You have new or more pain after the cast is put on.  You have fluid leaking through the cast.  You cannot move your fingers or toes.  Your fingers or toes turn colors or are cool, painful, or puffy (swollen).  You have tingling or lose feeling (numbness) around the injured area.  You have pain or pressure under the cast.  You have trouble breathing or have shortness of breath.  You have chest pain. MAKE SURE YOU:  Understand these instructions.  Will watch your condition.  Will get help right away if you  are not doing well or get worse. Document Released: 11/24/2010 Document Revised: 10/17/2011 Document Reviewed: 11/24/2010 [facility] Patient Information 2013 [place], [place]. Wrist Fracture A wrist fracture is a break in one of the bones of the wrist. Your wrist is made up of several small bones at the palm of your hand (carpal bones) and the two bones that make up your forearm (radius and ulna). The bones come together to form multiple large and small joints. The shape and design of these joints allow your wrist to bend and straighten, move side-to-side, and rotate, as in twisting your palm up or down. CAUSES  A fracture may occur in any of the bones in your wrist when enough force is applied to the wrist, such as when falling down onto an outstretched hand. Severe injuries may occur from a more forceful injury. SYMPTOMS Symptoms of wrist fractures include tenderness, bruising, and swelling. Additionally, the wrist may hang in an odd position or may be misshaped. DIAGNOSIS To diagnose a wrist fracture, your caregiver will physically examine your wrist. Your caregiver may also request an X-ray exam of your wrist. TREATMENT Treatment depends on many factors, including the nature and location of the fracture, your age, and your activity level. Treatment for  wrist fracture can be nonsurgical or surgical. For nonsurgical treatment, a plaster cast or splint may be applied to your wrist if the bone is in a good position (aligned). The cast will stay on for about 6 weeks. If the alignment of your bone is not good, it may be necessary to realign (reduce) it. After the bone is reduced, a splint usually is placed on your wrist to allow for a small amount of normal swelling. After about 1 week, the splint is removed and a cast is added. The cast is removed 2 or 3 weeks later, after the swelling goes down, causing the cast to loosen. Another cast is applied. This cast is removed after about another 2 or 3 weeks, for  a total of 4 to 6 weeks of immobilization. Sometimes the position of the bone is so far out of place that surgery is required to apply a device to hold it together as it heals. If the bone cannot be reduced without cutting the skin around the bone (closed reduction), a cut (incision) is made to allow direct access to the bone to reduce it (open reduction). Depending on the fracture, there are a number of options for holding the bone in place while it heals, including a cast, metal pins, a plate and screws, and a device called an external fixator. With an external fixator, most of the hardware remains outside of the body. HOME CARE INSTRUCTIONS  To lessen swelling, keep your injured wrist elevated and move your fingers as much as possible.  Apply ice to your wrist for the first 1 to 2 days after you have been treated or as directed by your caregiver. Applying ice helps to reduce inflammation and pain.  Put ice in a plastic bag.  Place a towel between your skin and the bag.  Leave the ice on for 15 to 20 minutes at a time, every 2 hours while you are awake.  Do not put pressure on any part of your cast or splint. It may break.  Use a plastic bag to protect your cast or splint from water while bathing or showering. Do not lower your cast or splint into water.  Only take over-the-counter or prescription medicines for pain as directed by your caregiver. SEEK IMMEDIATE MEDICAL CARE IF:   Your cast or splint gets damaged or breaks.  You have continued severe pain or more swelling than you did before the cast was put on.  Your skin or fingernails below the injury turn blue or gray or feel cold or numb.  You develop decreased feeling in your fingers. MAKE SURE YOU:  Understand these instructions.  Will watch your condition.  Will get help right away if you are not doing well or get worse. Document Released: 05/04/2005 Document Revised: 10/17/2011 Document Reviewed: 08/12/2011 [facility]  Patient Information 2013 [place], [place].

## 2012-10-15 ENCOUNTER — Encounter: Payer: Self-pay | Admitting: Family Medicine

## 2012-10-15 ENCOUNTER — Ambulatory Visit (INDEPENDENT_AMBULATORY_CARE_PROVIDER_SITE_OTHER): Payer: BC Managed Care – PPO | Admitting: Family Medicine

## 2012-10-15 VITALS — BP 157/82 | HR 78 | Ht 64.0 in | Wt 126.0 lb

## 2012-10-15 DIAGNOSIS — M25531 Pain in right wrist: Secondary | ICD-10-CM

## 2012-10-15 DIAGNOSIS — S5290XA Unspecified fracture of unspecified forearm, initial encounter for closed fracture: Secondary | ICD-10-CM

## 2012-10-15 DIAGNOSIS — M25539 Pain in unspecified wrist: Secondary | ICD-10-CM

## 2012-10-15 DIAGNOSIS — S52201A Unspecified fracture of shaft of right ulna, initial encounter for closed fracture: Secondary | ICD-10-CM

## 2012-10-15 MED ORDER — OXYCODONE-ACETAMINOPHEN 5-325 MG PO TABS: 1.0000 | ORAL_TABLET | Freq: Four times a day (QID) | ORAL | Status: AC | PRN

## 2012-10-15 NOTE — Patient Instructions (Signed)
Wear splint at all times as you would a cast. Elevate above your heart as much as possible. This will need 6 weeks of immobilization usually. Follow up with me in 1 week to have splint removed, repeat x-rays, and place a cast. Use either garbage bag or cast protector when bathing. Percocet as needed for severe pain. Ok to take ibuprofen if needed in addition to this.

## 2012-10-16 ENCOUNTER — Encounter: Payer: Self-pay | Admitting: Family Medicine

## 2012-10-16 DIAGNOSIS — M25531 Pain in right wrist: Secondary | ICD-10-CM | POA: Insufficient documentation

## 2012-10-16 NOTE — Progress Notes (Signed)
  Subjective:    Patient ID: Jocelyn Green, female    DOB: [birth year], 62 y.o.   MRN: [id number]  PCP: [facility]  HPI 62 yo F here for right wrist injury.  Patient reports early Saturday morning around 6:15 she was walking her dog when she slipped and suffered FOOSH injury to right wrist. Went to ED where x-rays showed nondisplaced distal radius and ulnar styloid fractures. Placed in volar and dorsal splints, referred here. Is right handed. Taking oxycodone as needed for pain.  Past Medical History  Diagnosis Date  . Blood transfusion   . GI bleed     Current Outpatient Prescriptions on File Prior to Visit  Medication Sig Dispense Refill  . methocarbamol (ROBAXIN) 500 MG tablet Take 1 tablet (500 mg total) by mouth 2 (two) times daily.  20 tablet  0   No current facility-administered medications on file prior to visit.    Past Surgical History  Procedure Laterality Date  . Abdominal hysterectomy    . Tonsillectomy      No Known Allergies  History   Social History  . Marital Status: Divorced    Spouse Name: N/A    Number of Children: N/A  . Years of Education: N/A   Occupational History  . Not on file.   Social History Main Topics  . Smoking status: Never Smoker   . Smokeless tobacco: Not on file  . Alcohol Use: Yes     Comment: occasionally  . Drug Use: No  . Sexually Active: Not on file   Other Topics Concern  . Not on file   Social History Narrative  . No narrative on file    Family History  Problem Relation Age of Onset  . Sudden death Neg Hx   . Hyperlipidemia Neg Hx   . Heart attack Neg Hx   . Diabetes Neg Hx   . Hypertension Neg Hx     BP 157/82  Pulse 78  Ht 5\' 4"  (1.626 m)  Wt 126 lb (57.153 kg)  BMI 21.62 kg/m2  Review of Systems See HPI above.    Objective:   Physical Exam Gen: NAD  R wrist: Mod swelling, bruising dorsally over distal radius and ulna.  No other deformities. TTP distal radius and ulna.  No elbow, other TTP  wrist or hand. Did not test ROM with known fracture. Able to abduct and extend fingers, oppose thumb. NVI distally.    Assessment & Plan:  1. Right nondisplaced distal radius, ulnar styloid fractures - Switched to a sugar tong splint for better stability and comfort.  Do not think with amount of swelling and this early in fracture we should place her directly into a cast.  Elevation, percocet as needed for pain (advised no driving on this medication or with splint on).  F/u in 1 week to remove splint, repeat x-rays, transition to a cast.

## 2012-10-16 NOTE — Assessment & Plan Note (Signed)
Right nondisplaced distal radius, ulnar styloid fractures - Switched to a sugar tong splint for better stability and comfort.  Do not think with amount of swelling and this early in fracture we should place her directly into a cast.  Elevation, percocet as needed for pain (advised no driving on this medication or with splint on).  F/u in 1 week to remove splint, repeat x-rays, transition to a cast.

## 2012-10-23 ENCOUNTER — Encounter: Payer: Self-pay | Admitting: Family Medicine

## 2012-10-23 ENCOUNTER — Ambulatory Visit (INDEPENDENT_AMBULATORY_CARE_PROVIDER_SITE_OTHER): Payer: BC Managed Care – PPO | Admitting: Family Medicine

## 2012-10-23 ENCOUNTER — Ambulatory Visit (HOSPITAL_BASED_OUTPATIENT_CLINIC_OR_DEPARTMENT_OTHER)
Admission: RE | Admit: 2012-10-23 | Discharge: 2012-10-23 | Disposition: A | Discharge status: Home / Self Care | Payer: BC Managed Care – PPO | Source: Ambulatory Visit | Attending: Family Medicine | Admitting: Family Medicine

## 2012-10-23 VITALS — BP 138/84 | HR 73 | Ht 64.0 in | Wt 130.0 lb

## 2012-10-23 DIAGNOSIS — M25531 Pain in right wrist: Secondary | ICD-10-CM

## 2012-10-23 DIAGNOSIS — M25539 Pain in unspecified wrist: Secondary | ICD-10-CM

## 2012-10-24 ENCOUNTER — Encounter: Payer: Self-pay | Admitting: Family Medicine

## 2012-10-24 NOTE — Assessment & Plan Note (Signed)
Right nondisplaced distal radius, ulnar styloid fractures - Repeat radiographs are unchanged without displacement of fractures.  Switched to a short arm cast today.  Percocet as needed for pain.  Anticipate 6 weeks of total immobilization.  Elevation.  F/u in 2 weeks to repeat radiographs, eval.

## 2012-10-24 NOTE — Progress Notes (Signed)
  Subjective:    Patient ID: Jocelyn Green, female    DOB: 1951/03/09, 62 y.o.   MRN: [id number]  PCP: [facility]  HPI  62 yo F here for f/u right wrist injury.  3/11: Patient reports early Saturday morning around 6:15 she was walking her dog when she slipped and suffered FOOSH injury to right wrist. Went to ED where x-rays showed nondisplaced distal radius and ulnar styloid fractures. Placed in volar and dorsal splints, referred here. Is right handed. Taking oxycodone as needed for pain.  3/18:  Patient returns for reevaluation. Continues to have pain though swelling has improved. Bruising apparent as well though turning yellow. Taking percocet as needed for pain. No new complaints.  Past Medical History  Diagnosis Date  . Blood transfusion   . GI bleed     Current Outpatient Prescriptions on File Prior to Visit  Medication Sig Dispense Refill  . methocarbamol (ROBAXIN) 500 MG tablet Take 1 tablet (500 mg total) by mouth 2 (two) times daily.  20 tablet  0  . oxyCODONE-acetaminophen (PERCOCET/ROXICET) 5-325 MG per tablet Take 1 tablet by mouth every 6 (six) hours as needed for pain.  60 tablet  0   No current facility-administered medications on file prior to visit.    Past Surgical History  Procedure Laterality Date  . Abdominal hysterectomy    . Tonsillectomy      No Known Allergies  History   Social History  . Marital Status: Divorced    Spouse Name: N/A    Number of Children: N/A  . Years of Education: N/A   Occupational History  . Not on file.   Social History Main Topics  . Smoking status: Never Smoker   . Smokeless tobacco: Not on file  . Alcohol Use: Yes     Comment: occasionally  . Drug Use: No  . Sexually Active: Not on file   Other Topics Concern  . Not on file   Social History Narrative  . No narrative on file    Family History  Problem Relation Age of Onset  . Sudden death Neg Hx   . Hyperlipidemia Neg Hx   . Heart attack Neg Hx    . Diabetes Neg Hx   . Hypertension Neg Hx     BP 138/84  Pulse 73  Ht 5\' 4"  (1.626 m)  Wt 130 lb (58.968 kg)  BMI 22.3 kg/m2  Review of Systems  See HPI above.    Objective:   Physical Exam  Gen: NAD  R wrist: Mild swelling, bruising dorsally over distal radius and ulna.  No other deformities. TTP distal radius and ulna.  No elbow, other TTP wrist or hand. Did not test ROM with known fracture. Able to abduct and extend fingers, oppose thumb. NVI distally.    Assessment & Plan:  1. Right nondisplaced distal radius, ulnar styloid fractures - Repeat radiographs are unchanged without displacement of fractures.  Switched to a short arm cast today.  Percocet as needed for pain.  Anticipate 6 weeks of total immobilization.  Elevation.  F/u in 2 weeks to repeat radiographs, eval.

## 2012-11-06 ENCOUNTER — Ambulatory Visit (INDEPENDENT_AMBULATORY_CARE_PROVIDER_SITE_OTHER): Payer: BC Managed Care – PPO | Admitting: Family Medicine

## 2012-11-06 ENCOUNTER — Ambulatory Visit (HOSPITAL_BASED_OUTPATIENT_CLINIC_OR_DEPARTMENT_OTHER)
Admission: RE | Admit: 2012-11-06 | Discharge: 2012-11-06 | Disposition: A | Discharge status: Home / Self Care | Payer: BC Managed Care – PPO | Source: Ambulatory Visit | Attending: Family Medicine | Admitting: Family Medicine

## 2012-11-06 VITALS — BP 129/79 | HR 78

## 2012-11-06 DIAGNOSIS — X58XXXA Exposure to other specified factors, initial encounter: Secondary | ICD-10-CM | POA: Insufficient documentation

## 2012-11-06 DIAGNOSIS — S52509A Unspecified fracture of the lower end of unspecified radius, initial encounter for closed fracture: Secondary | ICD-10-CM | POA: Insufficient documentation

## 2012-11-06 DIAGNOSIS — S52609A Unspecified fracture of lower end of unspecified ulna, initial encounter for closed fracture: Secondary | ICD-10-CM | POA: Insufficient documentation

## 2012-11-06 DIAGNOSIS — M25539 Pain in unspecified wrist: Secondary | ICD-10-CM

## 2012-11-06 DIAGNOSIS — M25531 Pain in right wrist: Secondary | ICD-10-CM

## 2012-11-08 ENCOUNTER — Encounter: Payer: Self-pay | Admitting: Family Medicine

## 2012-11-08 NOTE — Assessment & Plan Note (Signed)
Right nondisplaced distal radius, ulnar styloid fractures - Repeat radiographs show interval healing.  Placed new short arm cast today.  F/u in 2 weeks to remove, repeat x-rays.  May need one more cast but will depend on her clinical healing, how x-rays look at the time.  Elevation, percocet as needed.

## 2012-11-08 NOTE — Patient Instructions (Addendum)
Follow-up in 2 weeks

## 2012-11-08 NOTE — Progress Notes (Signed)
  Subjective:    Patient ID: Jocelyn Green, female    DOB: November 11, 1950, 62 y.o.   MRN: [id number]  PCP: [facility]  Wrist Pain    62 yo F here for f/u right wrist injury.  3/11: Patient reports early Saturday morning around 6:15 she was walking her dog when she slipped and suffered FOOSH injury to right wrist. Went to ED where x-rays showed nondisplaced distal radius and ulnar styloid fractures. Placed in volar and dorsal splints, referred here. Is right handed. Taking oxycodone as needed for pain.  3/18:  Patient returns for reevaluation. Continues to have pain though swelling has improved. Bruising apparent as well though turning yellow. Taking percocet as needed for pain. No new complaints.  4/1: Patient reports she's done well with the cast. Taking percocet as needed though pain is better. No longer with bruising. No new complaints.  Past Medical History  Diagnosis Date  . Blood transfusion   . GI bleed     Current Outpatient Prescriptions on File Prior to Visit  Medication Sig Dispense Refill  . methocarbamol (ROBAXIN) 500 MG tablet Take 1 tablet (500 mg total) by mouth 2 (two) times daily.  20 tablet  0  . oxyCODONE-acetaminophen (PERCOCET/ROXICET) 5-325 MG per tablet Take 1 tablet by mouth every 6 (six) hours as needed for pain.  60 tablet  0   No current facility-administered medications on file prior to visit.    Past Surgical History  Procedure Laterality Date  . Abdominal hysterectomy    . Tonsillectomy      No Known Allergies  History   Social History  . Marital Status: Divorced    Spouse Name: N/A    Number of Children: N/A  . Years of Education: N/A   Occupational History  . Not on file.   Social History Main Topics  . Smoking status: Never Smoker   . Smokeless tobacco: Not on file  . Alcohol Use: Yes     Comment: occasionally  . Drug Use: No  . Sexually Active: Not on file   Other Topics Concern  . Not on file   Social History  Narrative  . No narrative on file    Family History  Problem Relation Age of Onset  . Sudden death Neg Hx   . Hyperlipidemia Neg Hx   . Heart attack Neg Hx   . Diabetes Neg Hx   . Hypertension Neg Hx     BP 129/79  Pulse 78  Review of Systems  See HPI above.    Objective:   Physical Exam  Gen: NAD  R wrist: Mild swelling, no bruising dorsally over distal radius and ulna.  No other deformities. Mild TTP distal radius and ulna.  No elbow, other TTP wrist or hand. Did not test ROM with known fracture. Able to abduct and extend fingers, oppose thumb. NVI distally.    Assessment & Plan:  1. Right nondisplaced distal radius, ulnar styloid fractures - Repeat radiographs show interval healing.  Placed new short arm cast today.  F/u in 2 weeks to remove, repeat x-rays.  May need one more cast but will depend on her clinical healing, how x-rays look at the time.  Elevation, percocet as needed.

## 2012-11-15 ENCOUNTER — Ambulatory Visit: Payer: BC Managed Care – PPO | Admitting: Family Medicine

## 2012-11-20 ENCOUNTER — Ambulatory Visit (INDEPENDENT_AMBULATORY_CARE_PROVIDER_SITE_OTHER): Payer: BC Managed Care – PPO | Admitting: Family Medicine

## 2012-11-20 ENCOUNTER — Ambulatory Visit (HOSPITAL_BASED_OUTPATIENT_CLINIC_OR_DEPARTMENT_OTHER)
Admission: RE | Admit: 2012-11-20 | Discharge: 2012-11-20 | Disposition: A | Discharge status: Home / Self Care | Payer: BC Managed Care – PPO | Source: Ambulatory Visit | Attending: Family Medicine | Admitting: Family Medicine

## 2012-11-20 ENCOUNTER — Encounter: Payer: Self-pay | Admitting: Family Medicine

## 2012-11-20 VITALS — BP 130/86 | HR 64 | Ht 64.0 in | Wt 130.0 lb

## 2012-11-20 DIAGNOSIS — M899 Disorder of bone, unspecified: Secondary | ICD-10-CM | POA: Insufficient documentation

## 2012-11-20 DIAGNOSIS — M25531 Pain in right wrist: Secondary | ICD-10-CM

## 2012-11-20 DIAGNOSIS — S52509A Unspecified fracture of the lower end of unspecified radius, initial encounter for closed fracture: Secondary | ICD-10-CM | POA: Insufficient documentation

## 2012-11-20 DIAGNOSIS — X58XXXA Exposure to other specified factors, initial encounter: Secondary | ICD-10-CM | POA: Insufficient documentation

## 2012-11-20 DIAGNOSIS — M25539 Pain in unspecified wrist: Secondary | ICD-10-CM

## 2012-11-21 ENCOUNTER — Encounter: Payer: Self-pay | Admitting: Family Medicine

## 2012-11-21 NOTE — Progress Notes (Signed)
Subjective:    Patient ID: Jocelyn Green, female    DOB: 06-09-1951, 61 y.o.   MRN: [id number]  PCP: [facility]  Wrist Pain    62 yo F here for f/u right wrist injury.  3/11: Patient reports early Saturday morning around 6:15 she was walking her dog when she slipped and suffered FOOSH injury to right wrist. Went to ED where x-rays showed nondisplaced distal radius and ulnar styloid fractures. Placed in volar and dorsal splints, referred here. Is right handed. Taking oxycodone as needed for pain.  3/18:  Patient returns for reevaluation. Continues to have pain though swelling has improved. Bruising apparent as well though turning yellow. Taking percocet as needed for pain. No new complaints.  4/1: Patient reports she's done well with the cast. Taking percocet as needed though pain is better. No longer with bruising. No new complaints.  4/15: Patient reports it feels like something is in her cast. Overall she continues to improve though. Not taking much medication for pain. Feels like it's still a little swollen.  Past Medical History  Diagnosis Date  . Blood transfusion   . GI bleed     Current Outpatient Prescriptions on File Prior to Visit  Medication Sig Dispense Refill  . methocarbamol (ROBAXIN) 500 MG tablet Take 1 tablet (500 mg total) by mouth 2 (two) times daily.  20 tablet  0  . oxyCODONE-acetaminophen (PERCOCET/ROXICET) 5-325 MG per tablet Take 1 tablet by mouth every 6 (six) hours as needed for pain.  60 tablet  0   No current facility-administered medications on file prior to visit.    Past Surgical History  Procedure Laterality Date  . Abdominal hysterectomy    . Tonsillectomy      No Known Allergies  History   Social History  . Marital Status: Divorced    Spouse Name: N/A    Number of Children: N/A  . Years of Education: N/A   Occupational History  . Not on file.   Social History Main Topics  . Smoking status: Never Smoker   .  Smokeless tobacco: Not on file  . Alcohol Use: Yes     Comment: occasionally  . Drug Use: No  . Sexually Active: Not on file   Other Topics Concern  . Not on file   Social History Narrative  . No narrative on file    Family History  Problem Relation Age of Onset  . Sudden death Neg Hx   . Hyperlipidemia Neg Hx   . Heart attack Neg Hx   . Diabetes Neg Hx   . Hypertension Neg Hx     BP 130/86  Pulse 64  Ht 5\' 4"  (1.626 m)  Wt 130 lb (58.968 kg)  BMI 22.3 kg/m2  Review of Systems  See HPI above.    Objective:   Physical Exam  Gen: NAD  R wrist: Mild swelling, no bruising dorsally over distal radius and ulna.  No other deformities. No TTP distal radius and ulna.  No elbow, other TTP wrist or hand. Very limited ROM with flexion/extension, stiff. Able to abduct and extend fingers, oppose thumb. NVI distally.    Assessment & Plan:  1. Right nondisplaced distal radius, ulnar styloid fractures - Repeat radiographs again show healing and clinically she's no longer tender at fracture site.  Placed wrist brace today to wear as she would a cast for next week.  Only remove to clean area.  Elevation, icing, tylenol, aleve as needed.  Start PT  in 1 week to work on motion then strengthening.  F/u in 2 weeks for repeat x-rays, reevaluation.

## 2012-11-21 NOTE — Assessment & Plan Note (Signed)
Right nondisplaced distal radius, ulnar styloid fractures - Repeat radiographs again show healing and clinically she's no longer tender at fracture site.  Placed wrist brace today to wear as she would a cast for next week.  Only remove to clean area.  Elevation, icing, tylenol, aleve as needed.  Start PT in 1 week to work on motion then strengthening.  F/u in 2 weeks for repeat x-rays, reevaluation.

## 2012-11-28 ENCOUNTER — Ambulatory Visit: Payer: BC Managed Care – PPO | Attending: Family Medicine | Admitting: Rehabilitation

## 2012-11-28 DIAGNOSIS — IMO0001 Reserved for inherently not codable concepts without codable children: Secondary | ICD-10-CM | POA: Insufficient documentation

## 2012-11-28 DIAGNOSIS — M25539 Pain in unspecified wrist: Secondary | ICD-10-CM | POA: Insufficient documentation

## 2012-11-28 DIAGNOSIS — M25639 Stiffness of unspecified wrist, not elsewhere classified: Secondary | ICD-10-CM | POA: Insufficient documentation

## 2012-12-04 ENCOUNTER — Encounter: Payer: Self-pay | Admitting: Family Medicine

## 2012-12-04 ENCOUNTER — Ambulatory Visit (HOSPITAL_BASED_OUTPATIENT_CLINIC_OR_DEPARTMENT_OTHER)
Admission: RE | Admit: 2012-12-04 | Discharge: 2012-12-04 | Disposition: A | Discharge status: Home / Self Care | Payer: BC Managed Care – PPO | Source: Ambulatory Visit | Attending: Family Medicine | Admitting: Family Medicine

## 2012-12-04 ENCOUNTER — Ambulatory Visit (INDEPENDENT_AMBULATORY_CARE_PROVIDER_SITE_OTHER): Payer: BC Managed Care – PPO | Admitting: Family Medicine

## 2012-12-04 VITALS — BP 121/73 | HR 67 | Ht 64.0 in | Wt 125.0 lb

## 2012-12-04 DIAGNOSIS — M25531 Pain in right wrist: Secondary | ICD-10-CM

## 2012-12-04 DIAGNOSIS — S52509A Unspecified fracture of the lower end of unspecified radius, initial encounter for closed fracture: Secondary | ICD-10-CM | POA: Insufficient documentation

## 2012-12-04 DIAGNOSIS — X58XXXA Exposure to other specified factors, initial encounter: Secondary | ICD-10-CM | POA: Insufficient documentation

## 2012-12-04 DIAGNOSIS — M25539 Pain in unspecified wrist: Secondary | ICD-10-CM

## 2012-12-05 ENCOUNTER — Ambulatory Visit: Payer: BC Managed Care – PPO | Admitting: Rehabilitation

## 2012-12-05 ENCOUNTER — Encounter: Payer: Self-pay | Admitting: Family Medicine

## 2012-12-05 NOTE — Patient Instructions (Addendum)
Follow up in 4 weeks. Continue with therapy and home exercises. Use brace only as needed now.

## 2012-12-05 NOTE — Assessment & Plan Note (Signed)
Right nondisplaced distal radius, ulnar styloid fractures - Repeat radiographs again show excellent healing.  At this point focus is on regaining her motion.  Use wrist brace only as needed.  Icing, tylenol, aleve as needed.  Continue PT and HEP for next 4 weeks then reevaluate.  Do not plan to repeat imaging unless she has a new injury or worsens.

## 2012-12-05 NOTE — Progress Notes (Signed)
Subjective:    Patient ID: Jocelyn Green, female    DOB: 01/04/1951, 62 y.o.   MRN: [id number]  PCP: [facility]  Wrist Pain    62 yo F here for f/u right wrist injury.  3/11: Patient reports early Saturday morning around 6:15 she was walking her dog when she slipped and suffered FOOSH injury to right wrist. Went to ED where x-rays showed nondisplaced distal radius and ulnar styloid fractures. Placed in volar and dorsal splints, referred here. Is right handed. Taking oxycodone as needed for pain.  3/18:  Patient returns for reevaluation. Continues to have pain though swelling has improved. Bruising apparent as well though turning yellow. Taking percocet as needed for pain. No new complaints.  4/1: Patient reports she's done well with the cast. Taking percocet as needed though pain is better. No longer with bruising. No new complaints.  4/15: Patient reports it feels like something is in her cast. Overall she continues to improve though. Not taking much medication for pain. Feels like it's still a little swollen.  4/29: Patient reports her wrist feels better than last visit. Wearing wrist brace for support. Doing PT (has only had one visit to date though). Doing home exercises as well. Some numbness feeling into 3rd and 4th digits but able to move them fully.  Past Medical History  Diagnosis Date  . Blood transfusion   . GI bleed     Current Outpatient Prescriptions on File Prior to Visit  Medication Sig Dispense Refill  . methocarbamol (ROBAXIN) 500 MG tablet Take 1 tablet (500 mg total) by mouth 2 (two) times daily.  20 tablet  0  . oxyCODONE-acetaminophen (PERCOCET/ROXICET) 5-325 MG per tablet Take 1 tablet by mouth every 6 (six) hours as needed for pain.  60 tablet  0   No current facility-administered medications on file prior to visit.    Past Surgical History  Procedure Laterality Date  . Abdominal hysterectomy    . Tonsillectomy      No Known  Allergies  History   Social History  . Marital Status: Divorced    Spouse Name: N/A    Number of Children: N/A  . Years of Education: N/A   Occupational History  . Not on file.   Social History Main Topics  . Smoking status: Never Smoker   . Smokeless tobacco: Not on file  . Alcohol Use: Yes     Comment: occasionally  . Drug Use: No  . Sexually Active: Not on file   Other Topics Concern  . Not on file   Social History Narrative  . No narrative on file    Family History  Problem Relation Age of Onset  . Sudden death Neg Hx   . Hyperlipidemia Neg Hx   . Heart attack Neg Hx   . Diabetes Neg Hx   . Hypertension Neg Hx     BP 121/73  Pulse 67  Ht 5\' 4"  (1.626 m)  Wt 125 lb (56.7 kg)  BMI 21.45 kg/m2  Review of Systems  See HPI above.    Objective:   Physical Exam  Gen: NAD  R wrist: Mild swelling dorsal wrist but improved.  No bruising, other deformities. No TTP distal radius and ulna.  No elbow, other TTP wrist or hand. Mod limitation of ROM with flexion/extension, stiff. Able to abduct and extend fingers, oppose thumb. NVI distally.    Assessment & Plan:  1. Right nondisplaced distal radius, ulnar styloid fractures - Repeat radiographs  again show excellent healing.  At this point focus is on regaining her motion.  Use wrist brace only as needed.  Icing, tylenol, aleve as needed.  Continue PT and HEP for next 4 weeks then reevaluate.  Do not plan to repeat imaging unless she has a new injury or worsens.

## 2012-12-12 ENCOUNTER — Ambulatory Visit: Payer: BC Managed Care – PPO | Attending: Family Medicine | Admitting: Rehabilitation

## 2012-12-12 DIAGNOSIS — IMO0001 Reserved for inherently not codable concepts without codable children: Secondary | ICD-10-CM | POA: Insufficient documentation

## 2012-12-12 DIAGNOSIS — M25539 Pain in unspecified wrist: Secondary | ICD-10-CM | POA: Insufficient documentation

## 2012-12-12 DIAGNOSIS — M25639 Stiffness of unspecified wrist, not elsewhere classified: Secondary | ICD-10-CM | POA: Insufficient documentation

## 2012-12-19 ENCOUNTER — Ambulatory Visit: Payer: BC Managed Care – PPO | Admitting: Rehabilitation

## 2012-12-26 ENCOUNTER — Ambulatory Visit: Payer: BC Managed Care – PPO | Admitting: Rehabilitation

## 2013-01-01 ENCOUNTER — Ambulatory Visit: Payer: BC Managed Care – PPO | Admitting: Family Medicine

## 2013-01-02 ENCOUNTER — Ambulatory Visit: Payer: BC Managed Care – PPO | Admitting: Rehabilitation

## 2013-01-08 ENCOUNTER — Encounter: Payer: Self-pay | Admitting: Family Medicine

## 2013-01-08 ENCOUNTER — Ambulatory Visit (INDEPENDENT_AMBULATORY_CARE_PROVIDER_SITE_OTHER): Payer: BC Managed Care – PPO | Admitting: Family Medicine

## 2013-01-08 VITALS — BP 141/78 | HR 65 | Ht 64.0 in | Wt 129.0 lb

## 2013-01-08 DIAGNOSIS — M25539 Pain in unspecified wrist: Secondary | ICD-10-CM

## 2013-01-08 DIAGNOSIS — M25531 Pain in right wrist: Secondary | ICD-10-CM

## 2013-01-08 NOTE — Patient Instructions (Addendum)
Nerve symptoms can take up to 1- 1 1/2 years to completely resolve. Take vitamin B6 50 mg daily - this may promote nerve recovery. Vitamin C 500mg  daily is more a preventative for reflex sympathetic dystrophy - I would take this as well for 2 months. Can consider a nerve blocking medication like neurontin or lyrica if not improving. I don't think nerve conduction studies are worthwhile doing at this point. Follow up with me as needed and call with any concerns.

## 2013-01-09 ENCOUNTER — Encounter: Payer: Self-pay | Admitting: Family Medicine

## 2013-01-09 NOTE — Assessment & Plan Note (Signed)
Right nondisplaced distal radius, ulnar styloid fractures - Excellent improvement in function and no pain at fracture site.  Primary issue is numbness in 3rd, 4th fingers with electrical shock sensation at times into pinky.  Does not have the appearance of RSD - advised to start taking vitamin C as preventative.  With excellent function believe she's dealing more with irritation of digital nerves distal to fracture site than injury to nerves to hand.  Takes months to resolve usually.  Consider Vitamin B6, neurontin/lyrica.  Nerve conduction studies unlikely to be helpful at this point.  Call with any questions.  May f/u in 3 months if not improving.

## 2013-01-09 NOTE — Progress Notes (Signed)
Subjective:    Patient ID: Jocelyn Green, female    DOB: 08-Sep-1950, 62 y.o.   MRN: [id number]  PCP: [facility]  Wrist Pain    62 yo F here for f/u right wrist injury.  3/11: Patient reports early Saturday morning around 6:15 she was walking her dog when she slipped and suffered FOOSH injury to right wrist. Went to ED where x-rays showed nondisplaced distal radius and ulnar styloid fractures. Placed in volar and dorsal splints, referred here. Is right handed. Taking oxycodone as needed for pain.  3/18:  Patient returns for reevaluation. Continues to have pain though swelling has improved. Bruising apparent as well though turning yellow. Taking percocet as needed for pain. No new complaints.  4/1: Patient reports she's done well with the cast. Taking percocet as needed though pain is better. No longer with bruising. No new complaints.  4/15: Patient reports it feels like something is in her cast. Overall she continues to improve though. Not taking much medication for pain. Feels like it's still a little swollen.  4/29: Patient reports her wrist feels better than last visit. Wearing wrist brace for support. Doing PT (has only had one visit to date though). Doing home exercises as well. Some numbness feeling into 3rd and 4th digits but able to move them fully.  6/3: Patient reports she is finished with PT. Done very well and regained most of her motion there. Primary issue is still the numbness feeling she gets into 3rd and 4th digits. Still has full function of hand though. Can get an electrical shock sensation in pinky too if she turns the wrong way. Swelling has improved. Nothing in particular brings on her numbness, shock sensation.  Past Medical History  Diagnosis Date  . Blood transfusion   . GI bleed     Current Outpatient Prescriptions on File Prior to Visit  Medication Sig Dispense Refill  . methocarbamol (ROBAXIN) 500 MG tablet Take 1 tablet (500 mg  total) by mouth 2 (two) times daily.  20 tablet  0  . oxyCODONE-acetaminophen (PERCOCET/ROXICET) 5-325 MG per tablet Take 1 tablet by mouth every 6 (six) hours as needed for pain.  60 tablet  0   No current facility-administered medications on file prior to visit.    Past Surgical History  Procedure Laterality Date  . Abdominal hysterectomy    . Tonsillectomy      No Known Allergies  History   Social History  . Marital Status: Divorced    Spouse Name: N/A    Number of Children: N/A  . Years of Education: N/A   Occupational History  . Not on file.   Social History Main Topics  . Smoking status: Never Smoker   . Smokeless tobacco: Not on file  . Alcohol Use: Yes     Comment: occasionally  . Drug Use: No  . Sexually Active: Not on file   Other Topics Concern  . Not on file   Social History Narrative  . No narrative on file    Family History  Problem Relation Age of Onset  . Sudden death Neg Hx   . Hyperlipidemia Neg Hx   . Heart attack Neg Hx   . Diabetes Neg Hx   . Hypertension Neg Hx     BP 141/78  Pulse 65  Ht 5\' 4"  (1.626 m)  Wt 129 lb (58.514 kg)  BMI 22.13 kg/m2  Review of Systems  See HPI above.    Objective:   Physical  Exam  Gen: NAD  R wrist: Minimal swelling dorsal wrist, improved.  No warmth, redness, other deformities. No TTP distal radius and ulna.  No elbow, other TTP wrist or hand. Minimal limitation of extension compared to left side - otherwise ROM back to normal. Able to abduct and extend fingers, oppose thumb. NVI distally.    Assessment & Plan:  1. Right nondisplaced distal radius, ulnar styloid fractures - Excellent improvement in function and no pain at fracture site.  Primary issue is numbness in 3rd, 4th fingers with electrical shock sensation at times into pinky.  Does not have the appearance of RSD - advised to start taking vitamin C as preventative.  With excellent function believe she's dealing more with irritation of  digital nerves distal to fracture site than injury to nerves to hand.  Takes months to resolve usually.  Consider Vitamin B6, neurontin/lyrica.  Nerve conduction studies unlikely to be helpful at this point.  Call with any questions.  May f/u in 3 months if not improving.

## 2014-01-17 IMAGING — CR DG WRIST COMPLETE 3+V*R*
4 series · 4 of 4 positions shown · non-contrast
Comparison: 11/06/2012

CLINICAL DATA: Follow up distal radial and ulnar fracture.

RIGHT WRIST - COMPLETE 3+ VIEW

[x wrist pa right]
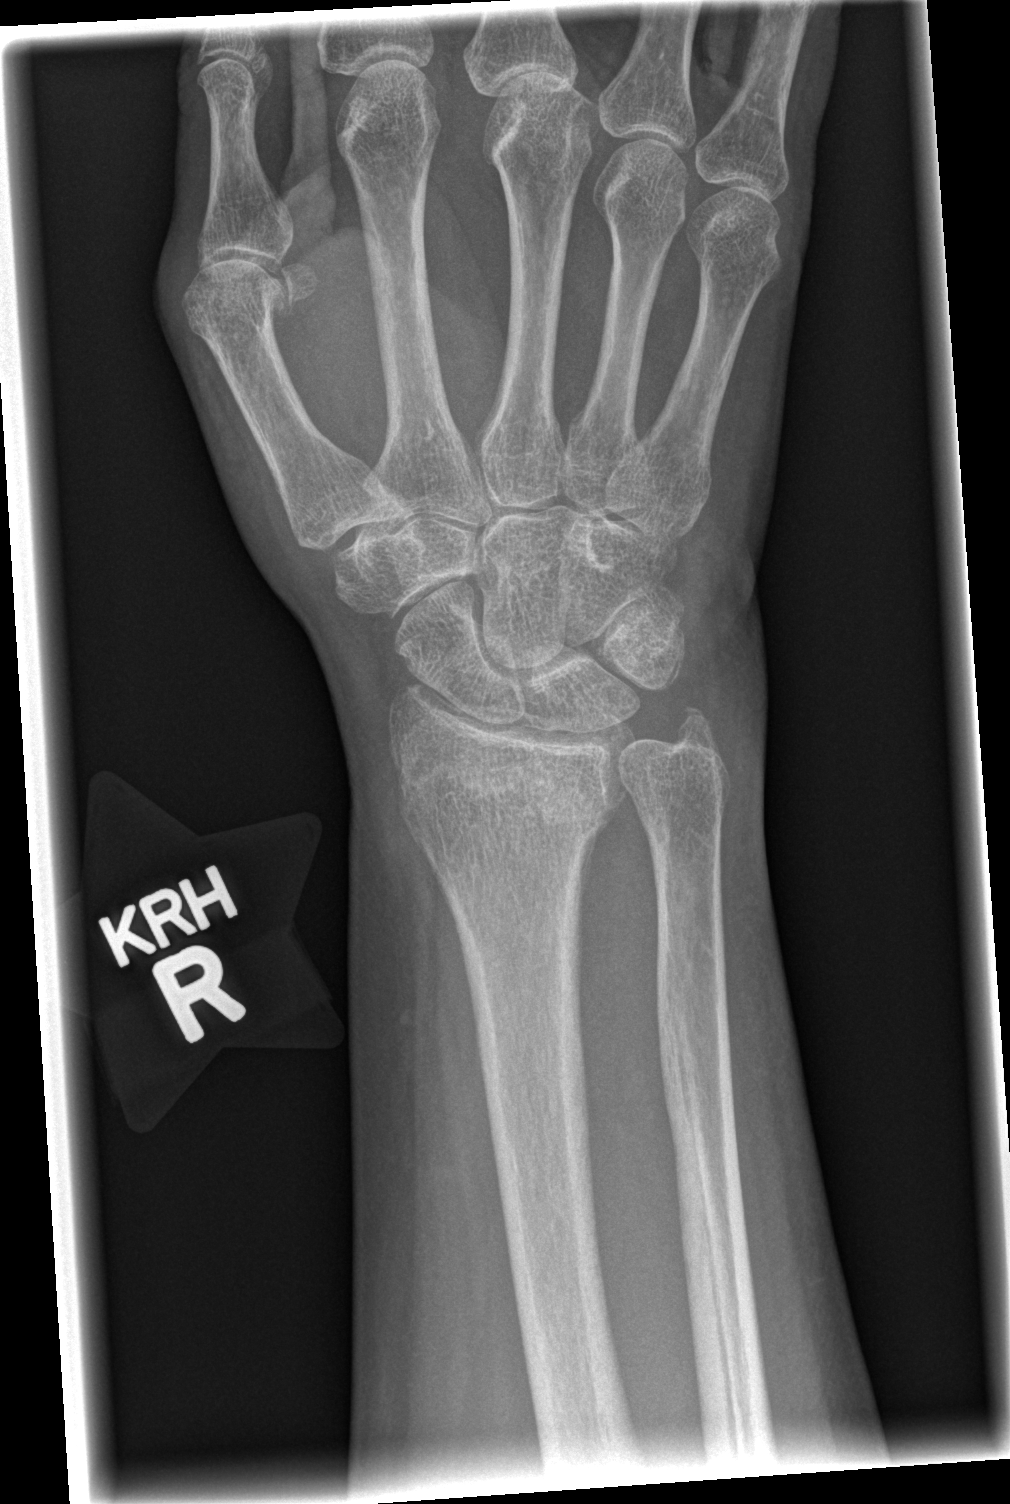

[x wrist obl right]
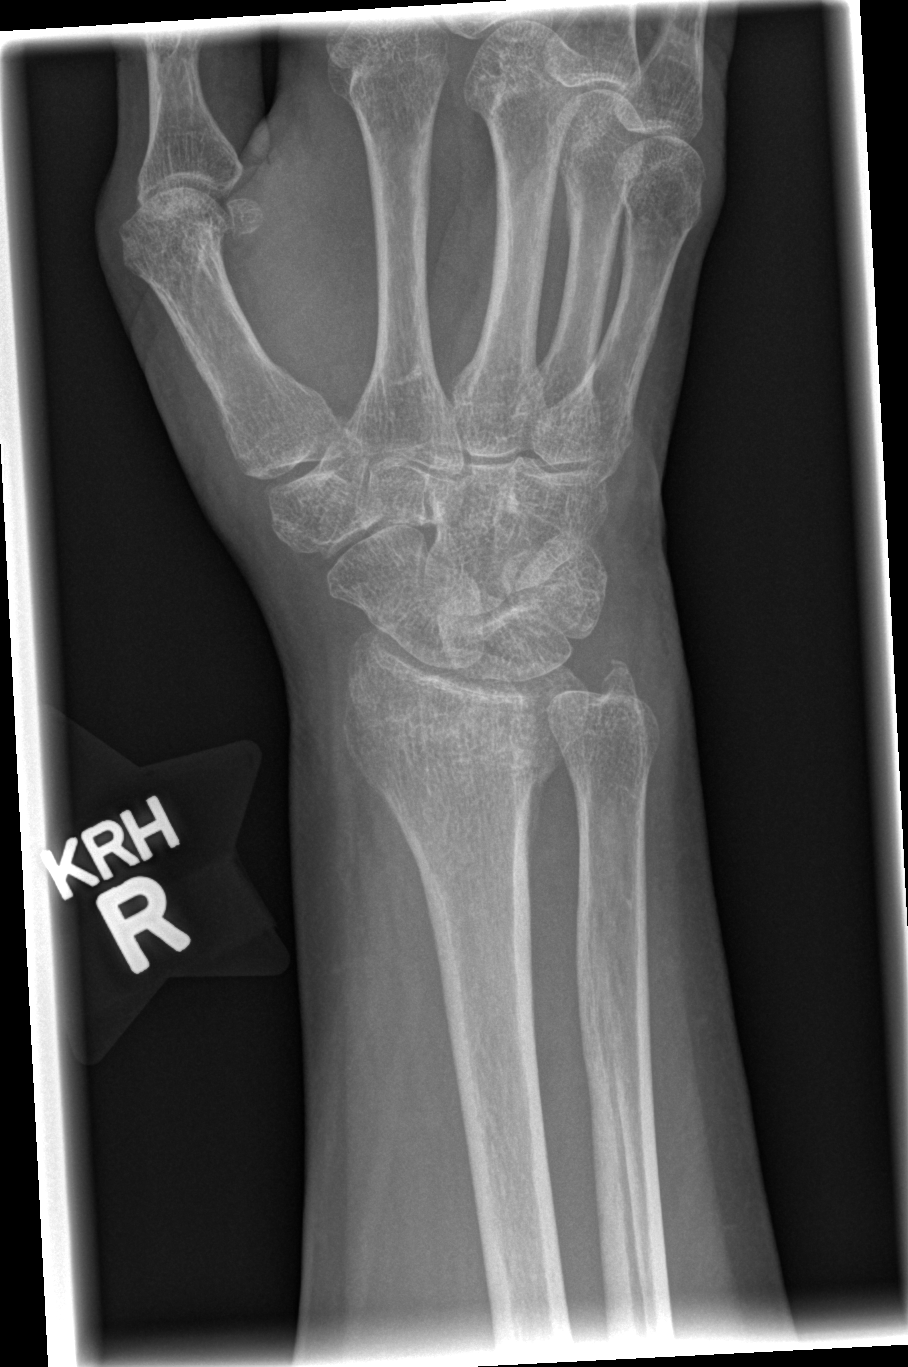

[x wrist lat right]
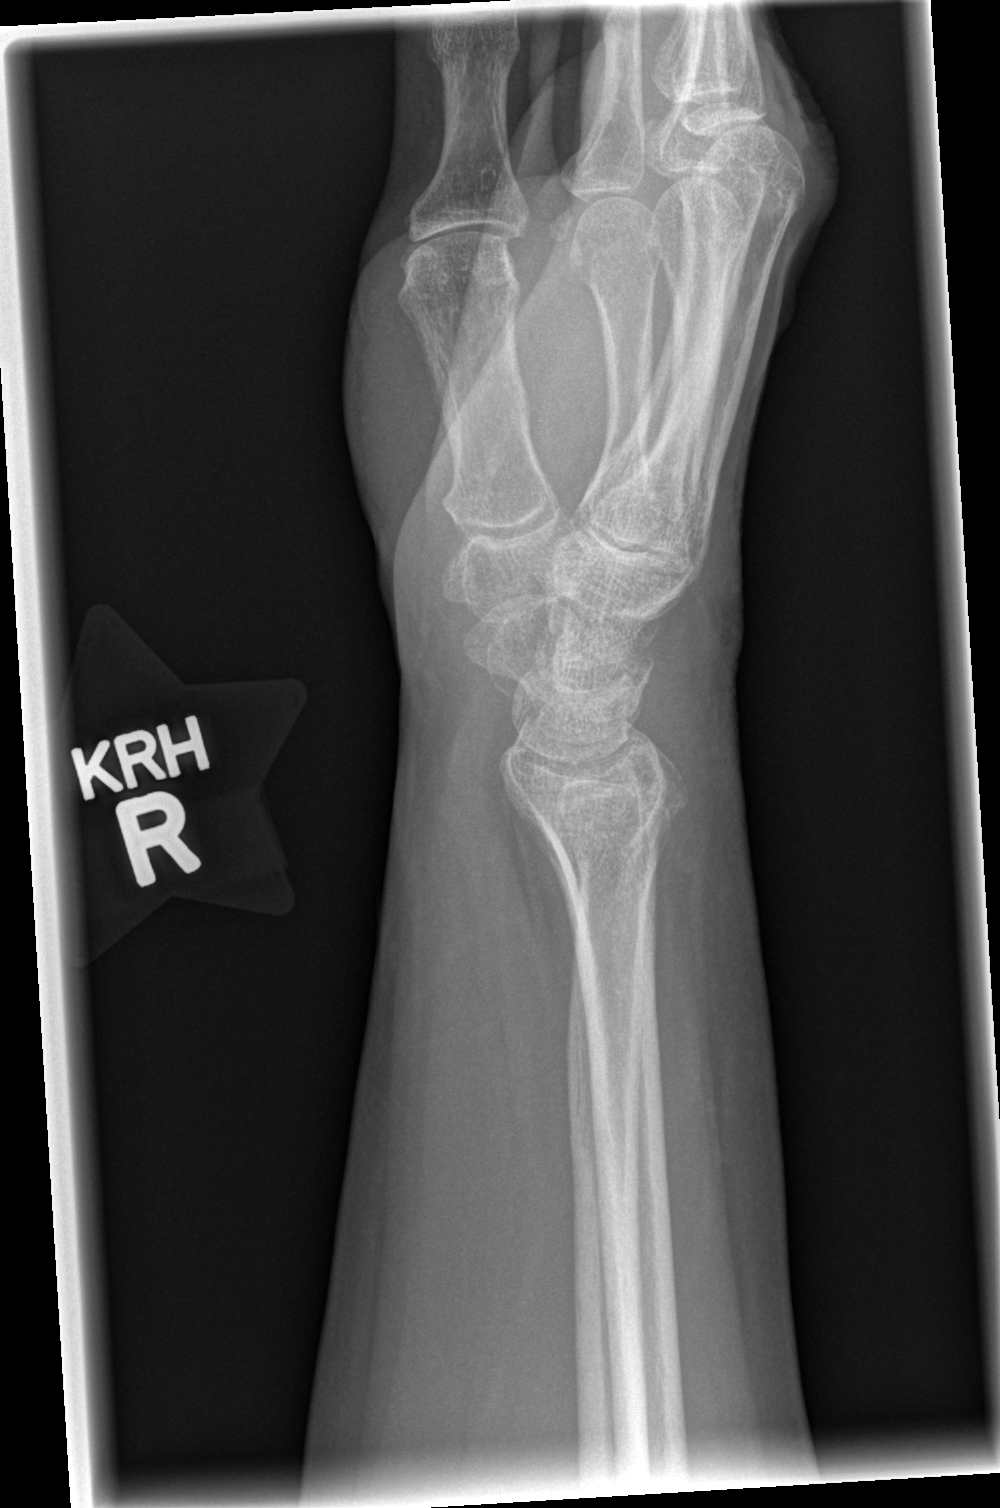

[x navicular]
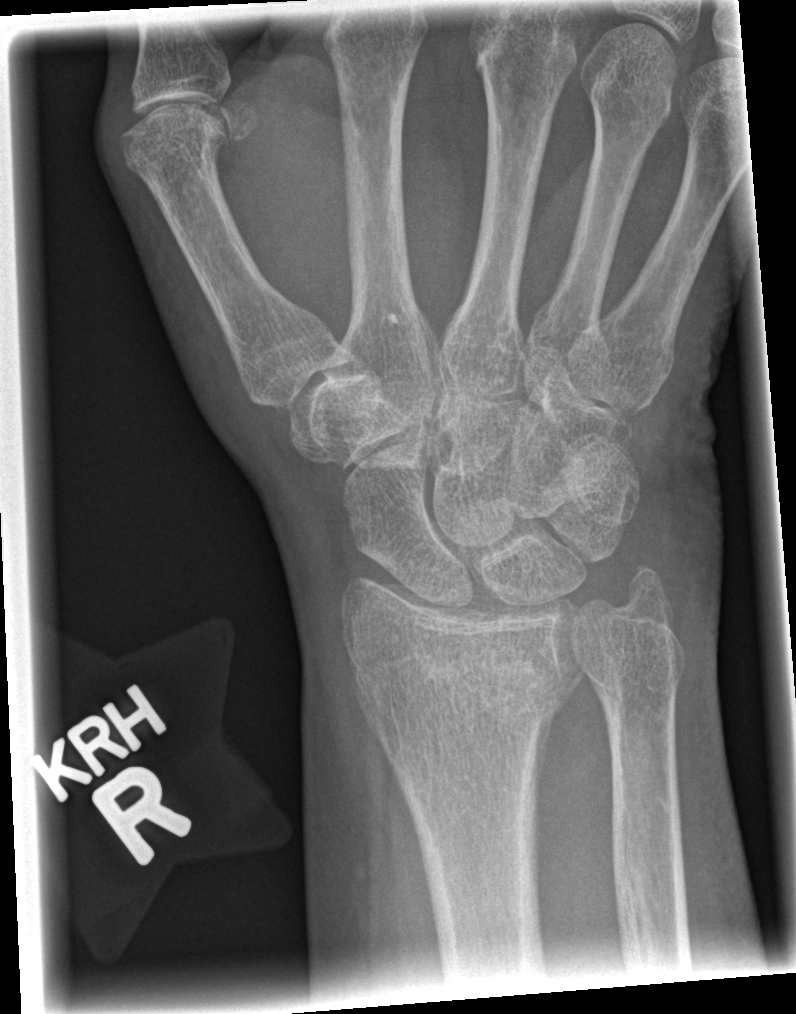

[4 of 4 positions shown; findings below may reference images not displayed]

FINDINGS: Four views of the right wrist submitted.  Diffuse
osteopenia.  Again noted healing nondisplaced fracture of the
distal right radius without change in the alignment. Stable
nondisplaced fracture of the ulnar styloid.
IMPRESSION: Diffuse osteopenia.  Again noted healing nondisplaced fracture of
the distal right radius without change in the alignment.] Stable
nondisplaced fracture of the ulnar styloid.

## 2014-01-31 IMAGING — CR DG WRIST COMPLETE 3+V*R*
4 series · 4 of 4 positions shown · non-contrast
Comparison: Previous examinations, the most recent dated
11/20/2012.

CLINICAL DATA: Follow-up healing distal radius fracture and ulnar
styloid fracture.

RIGHT WRIST - COMPLETE 3+ VIEW

[x wrist pa right]
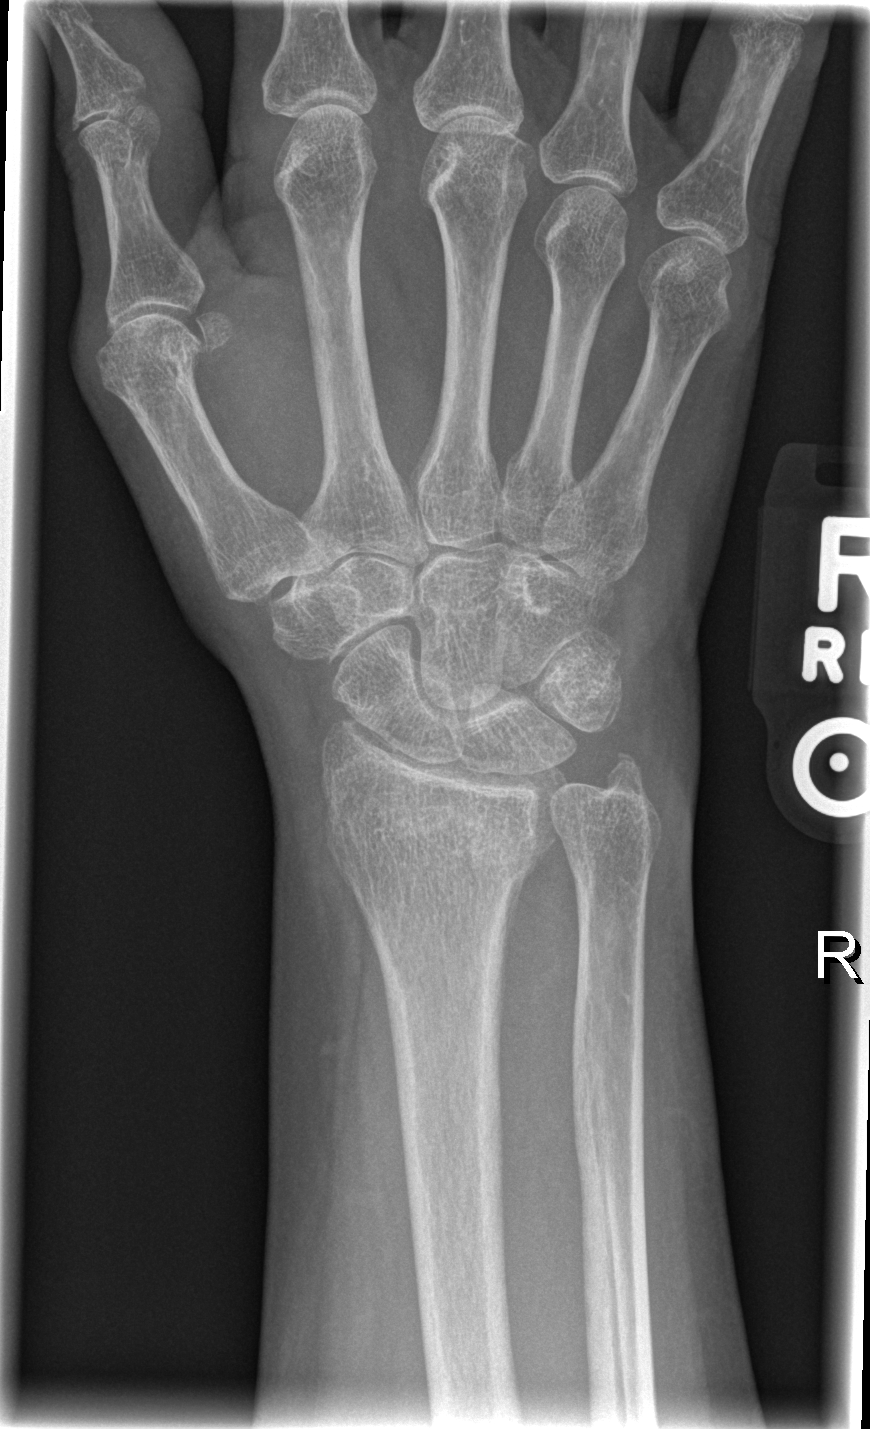

[x wrist obl right]
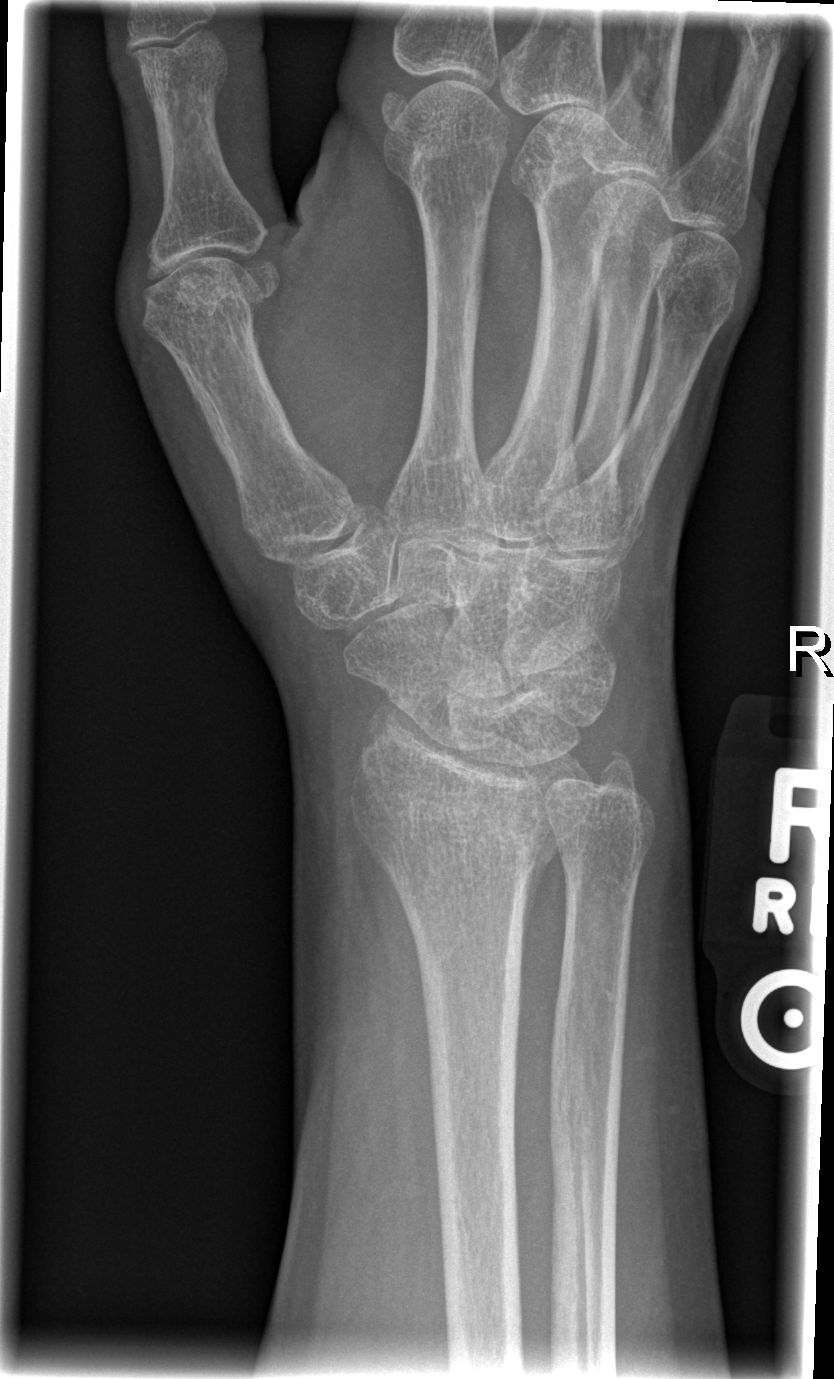

[x wrist lat right]
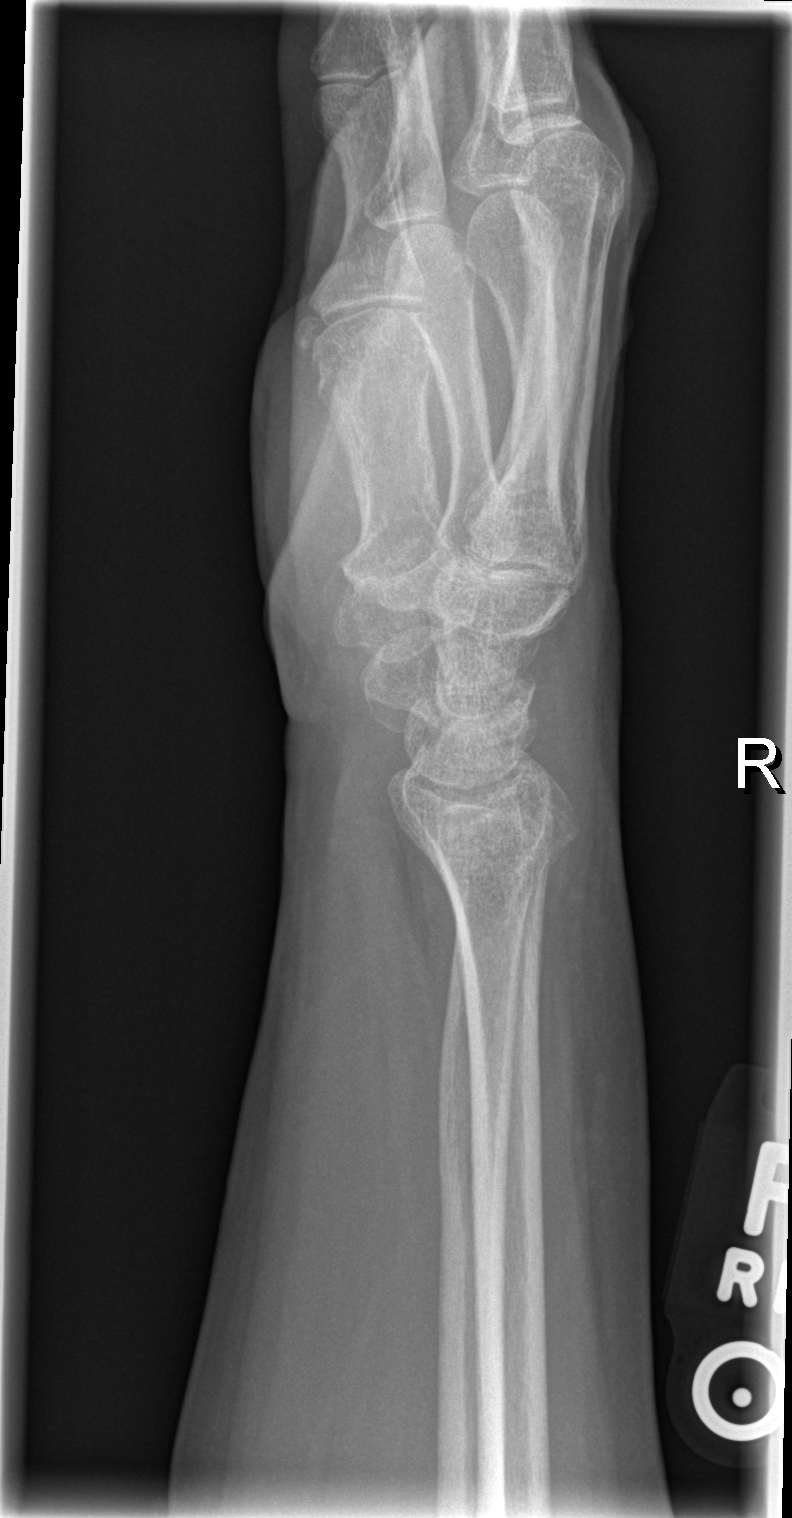

[x navicular]
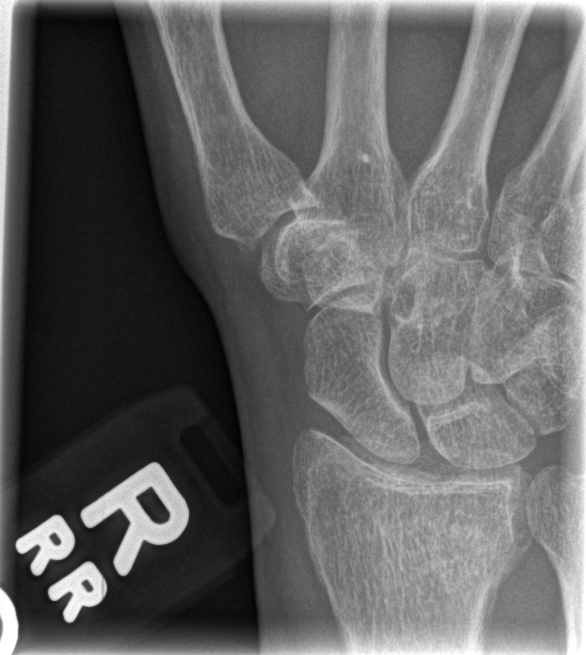

[4 of 4 positions shown; findings below may reference images not displayed]

FINDINGS: There has been further healing of the previously
demonstrated radius fracture without visible fracture lines.  The
previously demonstrated ulnar styloid tip fracture also appears
healed.  No new abnormalities.
IMPRESSION: Further healing of previously demonstrated distal radius and ulnar
styloid fractures, as described above.

## 2017-12-03 ENCOUNTER — Emergency Department (HOSPITAL_BASED_OUTPATIENT_CLINIC_OR_DEPARTMENT_OTHER): Payer: BLUE CROSS/BLUE SHIELD

## 2017-12-03 ENCOUNTER — Emergency Department (HOSPITAL_BASED_OUTPATIENT_CLINIC_OR_DEPARTMENT_OTHER)
Admission: EM | Admit: 2017-12-03 | Discharge: 2017-12-03 | Disposition: A | Discharge status: Home / Self Care | Payer: BLUE CROSS/BLUE SHIELD | Attending: Emergency Medicine | Admitting: Emergency Medicine

## 2017-12-03 ENCOUNTER — Encounter (HOSPITAL_BASED_OUTPATIENT_CLINIC_OR_DEPARTMENT_OTHER): Payer: Self-pay | Admitting: Emergency Medicine

## 2017-12-03 ENCOUNTER — Other Ambulatory Visit: Payer: Self-pay

## 2017-12-03 DIAGNOSIS — S6992XA Unspecified injury of left wrist, hand and finger(s), initial encounter: Secondary | ICD-10-CM | POA: Diagnosis present

## 2017-12-03 DIAGNOSIS — W231XXA Caught, crushed, jammed, or pinched between stationary objects, initial encounter: Secondary | ICD-10-CM | POA: Insufficient documentation

## 2017-12-03 DIAGNOSIS — Y999 Unspecified external cause status: Secondary | ICD-10-CM | POA: Insufficient documentation

## 2017-12-03 DIAGNOSIS — Z79899 Other long term (current) drug therapy: Secondary | ICD-10-CM | POA: Diagnosis not present

## 2017-12-03 DIAGNOSIS — S62639A Displaced fracture of distal phalanx of unspecified finger, initial encounter for closed fracture: Secondary | ICD-10-CM | POA: Diagnosis not present

## 2017-12-03 DIAGNOSIS — Y929 Unspecified place or not applicable: Secondary | ICD-10-CM | POA: Diagnosis not present

## 2017-12-03 DIAGNOSIS — S61313A Laceration without foreign body of left middle finger with damage to nail, initial encounter: Secondary | ICD-10-CM | POA: Insufficient documentation

## 2017-12-03 DIAGNOSIS — Y939 Activity, unspecified: Secondary | ICD-10-CM | POA: Diagnosis not present

## 2017-12-03 DIAGNOSIS — S61213A Laceration without foreign body of left middle finger without damage to nail, initial encounter: Secondary | ICD-10-CM

## 2017-12-03 MED ORDER — CEPHALEXIN 500 MG PO CAPS: 500.0000 mg | ORAL_CAPSULE | Freq: Four times a day (QID) | ORAL | 0 refills | Status: AC

## 2017-12-03 MED ORDER — BUPIVACAINE HCL (PF) 0.5 % IJ SOLN
INTRAMUSCULAR | Status: AC
  Administered 2017-12-03: 10 mL
  Filled 2017-12-03: qty 10

## 2017-12-03 MED ORDER — BUPIVACAINE HCL (PF) 0.5 % IJ SOLN: 10.0000 mL | Freq: Once | INTRAMUSCULAR | Status: AC

## 2017-12-03 NOTE — ED Provider Notes (Signed)
Patient slammed her right middle finger in a door earlier today causing laceration to distal phalanx.  No other injury.   Doug Sou, MD 12/03/17 [phone]

## 2017-12-03 NOTE — ED Provider Notes (Signed)
[facility] EMERGENCY DEPARTMENT Provider Note   CSN: [id number] Arrival date & time: 12/03/17  1144     History   Chief Complaint Chief Complaint  Patient presents with  . Finger Injury    HPI Jocelyn Green is a 67 y.o. female.  HPI Jocelyn Green is a 67 y.o. female, presenting to the ED with an injury to the left middle finger that occurred around 11:30 AM today.  States a door closed on the finger.  Initial pain throbbing, 10/10, radiating proximally. Tetanus up-to-date.  Denies numbness, weakness, other injuries.   Past Medical History:  Diagnosis Date  . Blood transfusion   . GI bleed     Patient Active Problem List   Diagnosis Date Noted  . Right wrist pain 10/16/2012  . Fracture of second metatarsal bone 05/14/2012  . De Quervain's tenosynovitis, left 05/14/2012  . Arthritis 05/14/2012    Past Surgical History:  Procedure Laterality Date  . ABDOMINAL HYSTERECTOMY    . TONSILLECTOMY       OB History   None      Home Medications    Prior to Admission medications   Medication Sig Start Date End Date Taking? Authorizing Provider  celecoxib (CELEBREX) 200 MG capsule Take 200 mg by mouth 2 (two) times daily.   Yes [provider]  propranolol (INDERAL) 20 MG tablet Take 20 mg by mouth 3 (three) times daily.   Yes [provider]  cephALEXin (KEFLEX) 500 MG capsule Take 1 capsule (500 mg total) by mouth 4 (four) times daily. 12/03/17   Joy, Shawn C, PA-C  methocarbamol (ROBAXIN) 500 MG tablet Take 1 tablet (500 mg total) by mouth 2 (two) times daily. 10/13/12   Lorre Nick, MD  oxyCODONE-acetaminophen (PERCOCET/ROXICET) 5-325 MG per tablet Take 1 tablet by mouth every 6 (six) hours as needed for pain. 10/15/12   Lenda Kelp, MD    Family History Family History  Problem Relation Age of Onset  . Sudden death Neg Hx   . Hyperlipidemia Neg Hx   . Heart attack Neg Hx   . Diabetes Neg Hx   . Hypertension Neg Hx      Social History Social History   Tobacco Use  . Smoking status: Never Smoker  . Smokeless tobacco: Never Used  Substance Use Topics  . Alcohol use: Yes    Comment: occasionally  . Drug use: No     Allergies   Patient has no known allergies.   Review of Systems Review of Systems  Musculoskeletal: Positive for arthralgias.  Skin: Positive for wound.  Neurological: Negative for weakness and numbness.     Physical Exam Updated Vital Signs BP 132/81 (BP Location: Right Arm)   Pulse 86   Temp 98.1 F (36.7 C) (Oral)   Resp 18   Ht  (1.626 m)   Wt 56.7 kg (125 lb)   SpO2 98%   BMI 21.46 kg/m   Physical Exam  Constitutional: She appears well-developed and well-nourished. No distress.  HENT:  Head: Normocephalic and atraumatic.  Eyes: Conjunctivae are normal.  Neck: Neck supple.  Cardiovascular: Normal rate, regular rhythm and intact distal pulses.  Pulmonary/Chest: Effort normal.  Musculoskeletal: She exhibits tenderness.  Neurological: She is alert.  Sensation to light touch intact into the distal left middle finger. Flexion and extension against resistance intact at the DIP, PIP, and MCP joints of the left middle finger.  Skin: Skin is warm and dry. Capillary refill takes  less than 2 seconds. She is not diaphoretic. No pallor.  2 cm laceration to the distal, ulnar left middle finger.  Wound appears to extend under the nail and into the nailbed.  Ulnar side of the nail is loose, separated from the nailbed and loose from the nail matrix.  No noted foreign bodies or bony fragments in the wound.  Psychiatric: She has a normal mood and affect. Her behavior is normal.  Nursing note and vitals reviewed.                                  ED Treatments / Results  Labs (all labs ordered are listed, but only abnormal results are displayed) Labs Reviewed - No data to display  EKG None  Radiology Dg Finger Middle Right  Result  Date: 12/03/2017 CLINICAL DATA:  Pain after trauma. EXAM: RIGHT MIDDLE FINGER 2+V COMPARISON:  None. FINDINGS: There is a comminuted fracture through the tuft of the distal phalanx. No other fractures. No foreign bodies identified. IMPRESSION: Comminuted fracture through the distal tuft of the third finger. Electronically Signed   By: Gerome Sam III M.D   On: 12/03/2017 13:33    Procedures .Nerve Block Date/Time: 12/03/2017 2:28 PM Performed by: Anselm Pancoast, PA-C Authorized by: Anselm Pancoast, PA-C   Consent:    Consent obtained:  Verbal   Consent given by:  Patient   Risks discussed:  Allergic reaction, bleeding, pain, swelling, unsuccessful block and infection Indications:    Indications:  Procedural anesthesia Location:    Body area:  Upper extremity   Upper extremity nerve blocked: Digital, middle finger.   Laterality:  Left Pre-procedure details:    Skin preparation:  Alcohol Procedure details (see MAR for exact dosages):    Block needle gauge:  27 G   Anesthetic injected:  Bupivacaine 0.5% w/o epi   Injection procedure:  Anatomic landmarks identified, anatomic landmarks palpated, incremental injection, introduced needle and negative aspiration for blood Post-procedure details:    Outcome:  Anesthesia achieved   Patient tolerance of procedure:  Tolerated well, no immediate complications .Marland KitchenLaceration Repair Date/Time: 12/03/2017 4:35 PM Performed by: Anselm Pancoast, PA-C Authorized by: Anselm Pancoast, PA-C   Consent:    Consent obtained:  Verbal   Consent given by:  Patient   Risks discussed:  Infection, need for additional repair, poor cosmetic result, poor wound healing and pain Anesthesia (see MAR for exact dosages):    Anesthesia method:  Nerve block   Block location:  Digital, left middle finger   Block needle gauge:  27 G   Block anesthetic:  Bupivacaine 0.5% w/o epi   Block injection procedure:  Anatomic landmarks identified, anatomic landmarks palpated, introduced  needle, negative aspiration for blood and incremental injection   Block outcome:  Anesthesia achieved Laceration details:    Location:  Finger   Finger location:  L long finger   Length (cm):  2 Repair type:    Repair type:  Intermediate Pre-procedure details:    Preparation:  Patient was prepped and draped in usual sterile fashion Exploration:    Hemostasis achieved with:  Tourniquet   Wound exploration: wound explored through full range of motion   Treatment:    Area cleansed with:  Betadine and saline   Amount of cleaning:  Extensive   Irrigation solution:  Tap water   Irrigation method:  Tap Skin repair:    Repair method:  Sutures   Suture size:  5-0   Suture material:  Nylon   Suture technique:  Simple interrupted   Number of sutures:  5 Approximation:    Approximation:  Close Post-procedure details:    Dressing:  Sterile dressing (Xeroform gauze)   Patient tolerance of procedure:  Tolerated well, no immediate complications .Nail Removal Date/Time: 12/03/2017 4:05 PM Performed by: Anselm Pancoast, PA-C Authorized by: Anselm Pancoast, PA-C   Consent:    Consent obtained:  Verbal   Consent given by:  Patient   Risks discussed:  Bleeding, incomplete removal, permanent nail deformity and pain Location:    Hand:  L long finger Pre-procedure details:    Skin preparation:  Betadine   Preparation: Patient was prepped and draped in the usual sterile fashion   Anesthesia (see MAR for exact dosages):    Anesthesia method:  Nerve block   Block location:  Digital, middle finger   Block needle gauge:  27 G   Block anesthetic:  Bupivacaine 0.5% w/o epi   Block injection procedure:  Anatomic landmarks identified, anatomic landmarks palpated, introduced needle, negative aspiration for blood and incremental injection   Block outcome:  Anesthesia achieved Nail Removal:    Nail removed:  Partial   Nail side:  Ulnar   Nail bed repaired: no     Removed nail replaced and anchored: yes       Stented with:  Two 5-0 nylon sutures Post-procedure details:    Dressing:  Xeroform gauze and splint   Patient tolerance of procedure:  Tolerated well, no immediate complications .Splint Application Date/Time: 12/03/2017 5:00 PM Performed by: Anselm Pancoast, PA-C Authorized by: Anselm Pancoast, PA-C   Consent:    Consent obtained:  Verbal   Consent given by:  Patient   Risks discussed:  Pain and swelling Pre-procedure details:    Sensation:  Normal   Skin color:  Normal Procedure details:    Laterality:  Left   Location:  Finger   Finger:  L long finger   Splint type:  Finger   Supplies:  Aluminum splint Post-procedure details:    Pain:  Unchanged   Sensation:  Normal   Skin color:  Normal   Patient tolerance of procedure:  Tolerated well, no immediate complications Comments:     Procedure was performed by the Med Tech with my evaluation before and after. I was available for consultation throughout the procedure.      (including critical care time)  Medications Ordered in ED Medications  bupivacaine (MARCAINE) 0.5 % injection 10 mL (10 mLs Infiltration Given 12/03/17 1429)     Initial Impression / Assessment and Plan / ED Course  I have reviewed the triage vital signs and the nursing notes.  Pertinent labs & imaging results that were available during my care of the patient were reviewed by me and considered in my medical decision making (see chart for details).  Clinical Course as of Dec 04 1512  Sun Dec 03, 2017  1614 Spoke with Dr. Jena Gauss. States he will review the pictures and get back to me.    [SJ]  1625 Dr. Jena Gauss recommends repairing the exposed laceration and letting the nailbed heal by secondary intention.  Resecure the remove nail in place.  Apply a dressing of Xeroform gauze.  Dressing changes every other day.  Office follow-up in about 1 week.   [SJ]    Clinical Course User Index [SJ] Anselm Pancoast, PA-C    Patient presents  with an injury to the left  middle finger.  Underlying comminuted tuft fracture.  Exposed laceration repaired.  Nailbed to heal by secondary intention.  Hand surgery follow-up. The patient was given instructions for home care as well as return precautions. Patient voices understanding of these instructions, accepts the plan, and is comfortable with discharge.  Findings and plan of care discussed with Jacalyn Lefevre, MD. Dr. Particia Nearing personally evaluated and examined this patient.   Final Clinical Impressions(s) / ED Diagnoses   Final diagnoses:  Laceration of left middle finger without foreign body without damage to nail, initial encounter  Closed fracture of tuft of distal phalanx of finger    ED Discharge Orders        Ordered    cephALEXin (KEFLEX) 500 MG capsule  4 times daily     12/03/17 1652       Anselm Pancoast, PA-C 12/04/17 1516    Jacalyn Lefevre, MD 12/06/17 [phone]

## 2017-12-03 NOTE — ED Triage Notes (Signed)
Patient states that she hit her right middle finger in a door earlier today  - laceration and bruising noted

## 2017-12-03 NOTE — Discharge Instructions (Addendum)
Bandage should be changed every other day. Clean the wound and surrounding area gently with tap water and mild soap. Rinse well and blot dry. Do not scrub the wound, as this may cause the wound edges to come apart. You may shower, but avoid submerging the wound, such as with a bath or swimming. Clean the wound daily to prevent infection. Do not use cleaners such as hydrogen peroxide or alcohol.   Please take all of your antibiotics until finished!   You may develop abdominal discomfort or diarrhea from the antibiotic.  You may help offset this with probiotics which you can buy or get in yogurt. Do not eat or take the probiotics until 2 hours after your antibiotic.   Scar reduction: Application of a topical antibiotic ointment, such as Neosporin, after the wound has begun to close and heal well can decrease scab formation and reduce scarring. After the wound has healed and wound closures have been removed, application of ointments such as Aquaphor can also reduce scar formation.  The key to scar reduction is keeping the skin well hydrated and supple. Drinking plenty of water throughout the day (At least eight 8oz glasses of water a day) is essential to staying well hydrated.  Sun exposure: Keep the wound out of the sun. After the wound has healed, continue to protect it from the sun by wearing protective clothing or applying sunscreen.  Pain: You may use Tylenol, naproxen, or ibuprofen for pain.  Suture/staple removal: Sutures will be removed by the hand specialist, typically in 10-12 days.  Follow up: Follow up with the hand specialist as soon as possible. Call the number provided to set up an appointment.   Return to the ED sooner should the wound edges come apart or signs of infection arise, such as spreading redness, puffiness/swelling, pus draining from the wound, severe increase in pain, fever over 100.40F, or any other major issues.
